# Patient Record
Sex: Female | Born: 1945 | Race: White | Hispanic: No | Marital: Married | Smoking: Never smoker
Health system: Southern US, Community
[De-identification: ages and names within clinical notes are randomized; demographics above are authoritative.]

## PROBLEM LIST (undated history)

## (undated) ENCOUNTER — Emergency Department (HOSPITAL_BASED_OUTPATIENT_CLINIC_OR_DEPARTMENT_OTHER): Payer: Medicare Other | Source: Home / Self Care

## (undated) DIAGNOSIS — Z9889 Other specified postprocedural states: Secondary | ICD-10-CM

## (undated) DIAGNOSIS — N390 Urinary tract infection, site not specified: Secondary | ICD-10-CM

## (undated) DIAGNOSIS — I10 Essential (primary) hypertension: Secondary | ICD-10-CM

## (undated) DIAGNOSIS — E785 Hyperlipidemia, unspecified: Secondary | ICD-10-CM

## (undated) DIAGNOSIS — I491 Atrial premature depolarization: Secondary | ICD-10-CM

## (undated) HISTORY — DX: Hyperlipidemia, unspecified: E78.5

## (undated) HISTORY — PX: APPENDECTOMY: SHX54

## (undated) HISTORY — DX: Atrial premature depolarization: I49.1

## (undated) HISTORY — DX: Essential (primary) hypertension: I10

## (undated) HISTORY — DX: Other specified postprocedural states: Z98.890

## (undated) HISTORY — PX: DILATION AND CURETTAGE OF UTERUS: SHX78

## (undated) HISTORY — PX: BREAST LUMPECTOMY: SHX2

## (undated) HISTORY — PX: TRACHEOSTOMY: SUR1362

---

## 1997-12-17 ENCOUNTER — Other Ambulatory Visit: Admission: RE | Admit: 1997-12-17 | Discharge: 1997-12-17 | Payer: Self-pay | Admitting: Obstetrics & Gynecology

## 1999-04-20 ENCOUNTER — Ambulatory Visit (HOSPITAL_COMMUNITY): Admission: RE | Admit: 1999-04-20 | Discharge: 1999-04-20 | Payer: Self-pay | Admitting: *Deleted

## 1999-10-27 ENCOUNTER — Encounter: Admission: RE | Admit: 1999-10-27 | Discharge: 1999-10-27 | Payer: Self-pay | Admitting: Obstetrics & Gynecology

## 1999-10-27 ENCOUNTER — Encounter: Payer: Self-pay | Admitting: Obstetrics & Gynecology

## 1999-11-22 ENCOUNTER — Other Ambulatory Visit: Admission: RE | Admit: 1999-11-22 | Discharge: 1999-11-22 | Payer: Self-pay | Admitting: Obstetrics & Gynecology

## 2000-11-27 ENCOUNTER — Encounter: Admission: RE | Admit: 2000-11-27 | Discharge: 2000-11-27 | Payer: Self-pay | Admitting: Obstetrics & Gynecology

## 2000-11-27 ENCOUNTER — Encounter: Payer: Self-pay | Admitting: Obstetrics & Gynecology

## 2000-11-29 ENCOUNTER — Encounter: Admission: RE | Admit: 2000-11-29 | Discharge: 2000-11-29 | Payer: Self-pay | Admitting: Family Medicine

## 2000-11-29 ENCOUNTER — Encounter: Payer: Self-pay | Admitting: Obstetrics & Gynecology

## 2001-03-27 ENCOUNTER — Other Ambulatory Visit: Admission: RE | Admit: 2001-03-27 | Discharge: 2001-03-27 | Payer: Self-pay | Admitting: Obstetrics & Gynecology

## 2001-03-29 ENCOUNTER — Encounter: Admission: RE | Admit: 2001-03-29 | Discharge: 2001-03-29 | Payer: Self-pay | Admitting: Obstetrics & Gynecology

## 2001-03-29 ENCOUNTER — Encounter: Payer: Self-pay | Admitting: Obstetrics & Gynecology

## 2001-07-30 ENCOUNTER — Ambulatory Visit (HOSPITAL_COMMUNITY): Admission: RE | Admit: 2001-07-30 | Discharge: 2001-07-30 | Payer: Self-pay | Admitting: Gastroenterology

## 2001-09-17 ENCOUNTER — Encounter: Payer: Self-pay | Admitting: Obstetrics & Gynecology

## 2001-09-17 ENCOUNTER — Encounter: Admission: RE | Admit: 2001-09-17 | Discharge: 2001-09-17 | Payer: Self-pay | Admitting: Obstetrics & Gynecology

## 2002-05-05 ENCOUNTER — Encounter: Admission: RE | Admit: 2002-05-05 | Discharge: 2002-05-05 | Payer: Self-pay | Admitting: Obstetrics & Gynecology

## 2002-05-05 ENCOUNTER — Encounter: Payer: Self-pay | Admitting: Obstetrics & Gynecology

## 2002-10-13 ENCOUNTER — Encounter: Admission: RE | Admit: 2002-10-13 | Discharge: 2002-10-13 | Payer: Self-pay | Admitting: Family Medicine

## 2002-10-13 ENCOUNTER — Encounter: Payer: Self-pay | Admitting: Family Medicine

## 2002-10-15 ENCOUNTER — Other Ambulatory Visit: Admission: RE | Admit: 2002-10-15 | Discharge: 2002-10-15 | Payer: Self-pay | Admitting: Obstetrics & Gynecology

## 2003-04-27 ENCOUNTER — Encounter: Admission: RE | Admit: 2003-04-27 | Discharge: 2003-04-27 | Payer: Self-pay | Admitting: Family Medicine

## 2003-10-20 ENCOUNTER — Other Ambulatory Visit: Admission: RE | Admit: 2003-10-20 | Discharge: 2003-10-20 | Payer: Self-pay | Admitting: Obstetrics & Gynecology

## 2003-11-24 ENCOUNTER — Encounter: Admission: RE | Admit: 2003-11-24 | Discharge: 2003-11-24 | Payer: Self-pay | Admitting: Family Medicine

## 2004-04-05 ENCOUNTER — Ambulatory Visit: Payer: Self-pay | Admitting: Family Medicine

## 2004-04-05 ENCOUNTER — Encounter: Admission: RE | Admit: 2004-04-05 | Discharge: 2004-04-05 | Payer: Self-pay | Admitting: Family Medicine

## 2004-04-06 ENCOUNTER — Encounter: Admission: RE | Admit: 2004-04-06 | Discharge: 2004-04-06 | Payer: Self-pay | Admitting: Family Medicine

## 2004-04-06 ENCOUNTER — Ambulatory Visit: Payer: Self-pay | Admitting: Family Medicine

## 2004-04-14 ENCOUNTER — Ambulatory Visit: Payer: Self-pay | Admitting: Family Medicine

## 2004-04-25 ENCOUNTER — Ambulatory Visit: Payer: Self-pay | Admitting: Family Medicine

## 2004-05-10 ENCOUNTER — Encounter: Admission: RE | Admit: 2004-05-10 | Discharge: 2004-05-10 | Payer: Self-pay | Admitting: Gastroenterology

## 2004-09-01 ENCOUNTER — Ambulatory Visit: Payer: Self-pay | Admitting: Internal Medicine

## 2004-09-12 ENCOUNTER — Ambulatory Visit: Payer: Self-pay | Admitting: Internal Medicine

## 2004-09-21 ENCOUNTER — Ambulatory Visit: Payer: Self-pay

## 2005-01-04 ENCOUNTER — Other Ambulatory Visit: Admission: RE | Admit: 2005-01-04 | Discharge: 2005-01-04 | Payer: Self-pay | Admitting: Obstetrics & Gynecology

## 2005-03-09 ENCOUNTER — Ambulatory Visit: Payer: Self-pay | Admitting: Internal Medicine

## 2005-05-16 ENCOUNTER — Ambulatory Visit: Payer: Self-pay | Admitting: Internal Medicine

## 2005-05-16 ENCOUNTER — Encounter: Admission: RE | Admit: 2005-05-16 | Discharge: 2005-05-16 | Payer: Self-pay | Admitting: Internal Medicine

## 2005-05-26 ENCOUNTER — Ambulatory Visit: Payer: Self-pay | Admitting: Internal Medicine

## 2005-06-27 ENCOUNTER — Ambulatory Visit: Payer: Self-pay | Admitting: Internal Medicine

## 2005-07-18 ENCOUNTER — Ambulatory Visit: Payer: Self-pay | Admitting: Internal Medicine

## 2006-02-06 ENCOUNTER — Ambulatory Visit (HOSPITAL_COMMUNITY): Admission: RE | Admit: 2006-02-06 | Discharge: 2006-02-06 | Payer: Self-pay | Admitting: Obstetrics & Gynecology

## 2006-02-28 ENCOUNTER — Encounter: Admission: RE | Admit: 2006-02-28 | Discharge: 2006-02-28 | Payer: Self-pay | Admitting: Obstetrics & Gynecology

## 2006-03-23 IMAGING — US US ABDOMEN COMPLETE
1 series · 14 of 25 positions shown · non-contrast
Comparison: none

CLINICAL DATA: Abdominal pain.  History of previous bleeding ulcer.  Hypertension.
 ULTRASOUND ABDOMEN COMPLETE:
 There is no evidence of gallstones or gallbladder wall thickening. There is no evidence of biliary ductal dilatation. The liver is within normal limits in echogenicity and no focal parenchymal lesions are identified. The visualized portion of the pancreas is unremarkable in appearance.

[Series 1: unknown · 0.27mm/px · 14 of 55 slices shown]
[im 1/55]
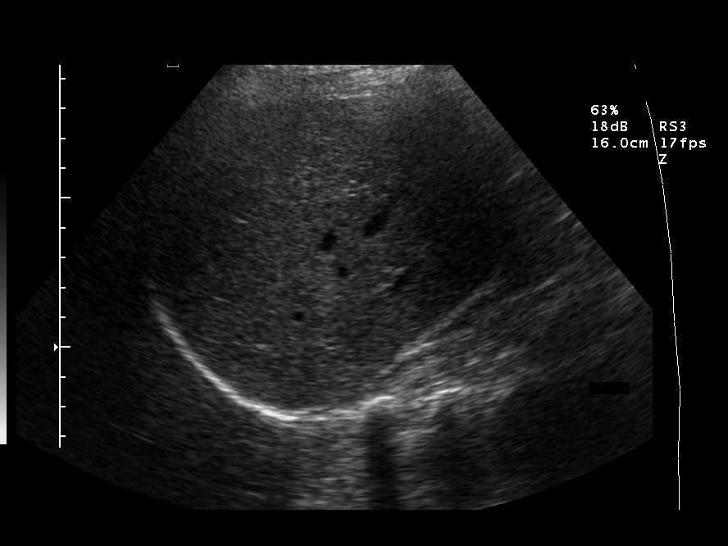
[im 5/55]
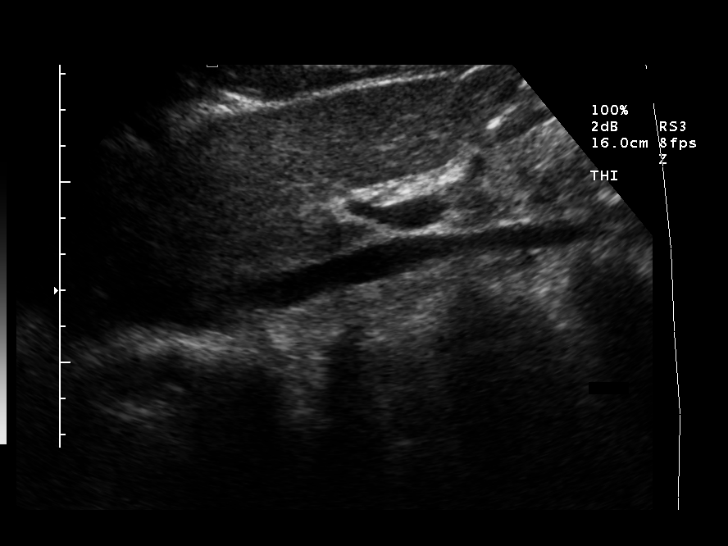
[im 10/55]
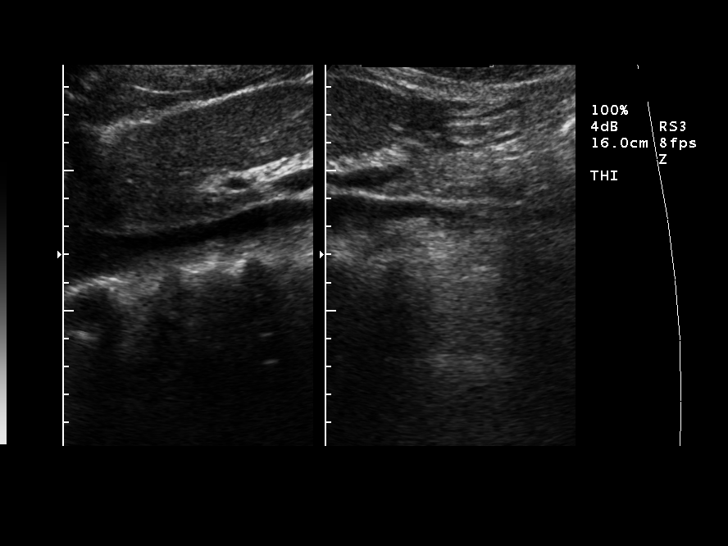
[im 14/55]
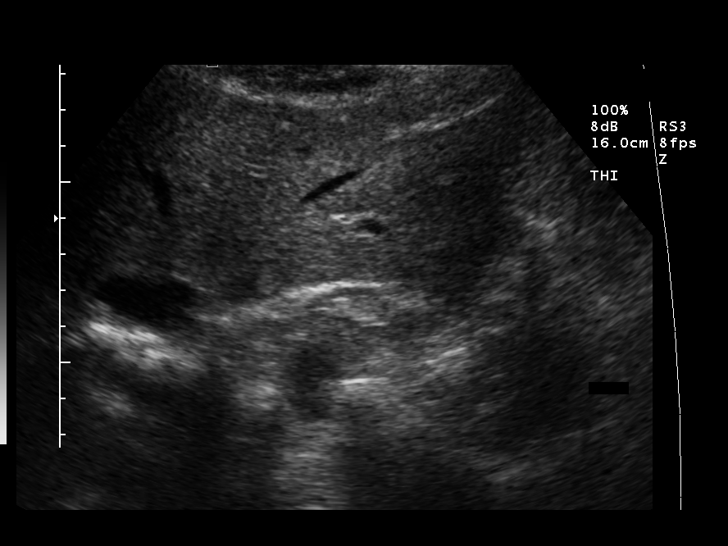
[im 19/55]
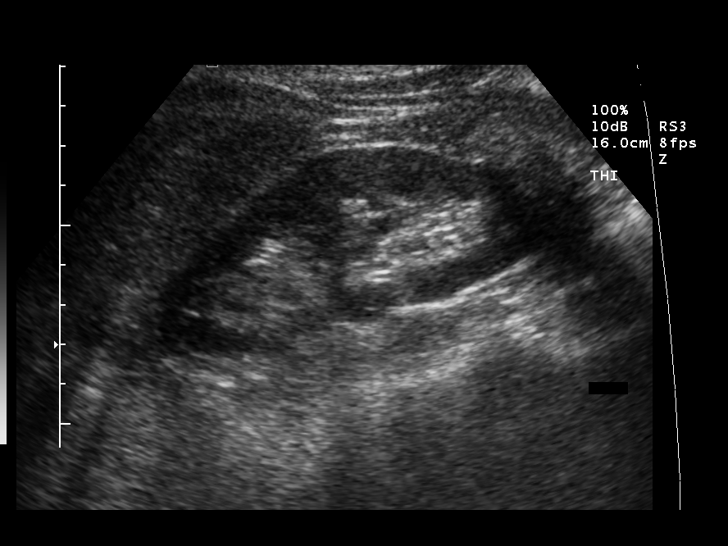
[im 21/55]
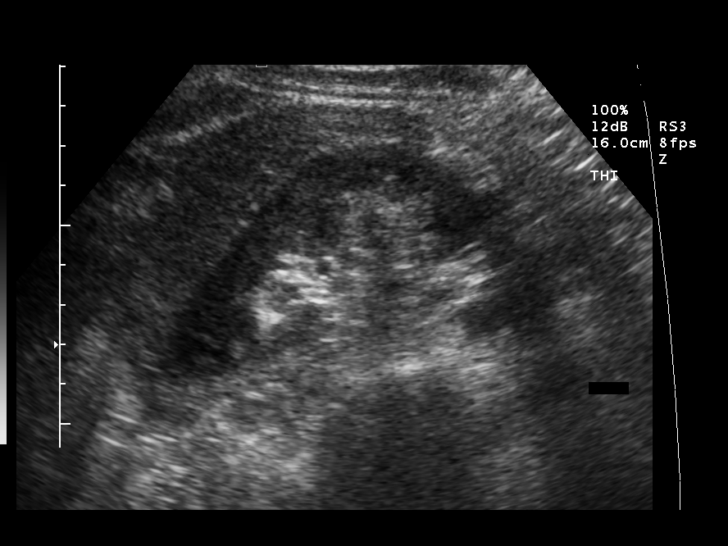
[im 25/55]
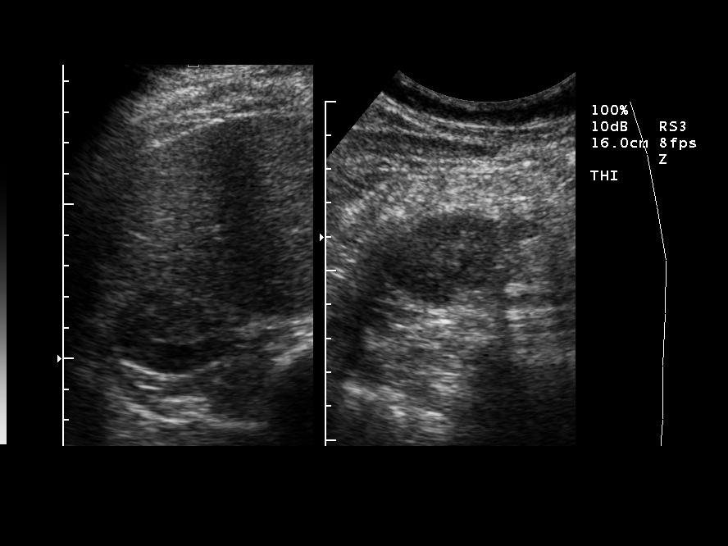
[im 30/55]
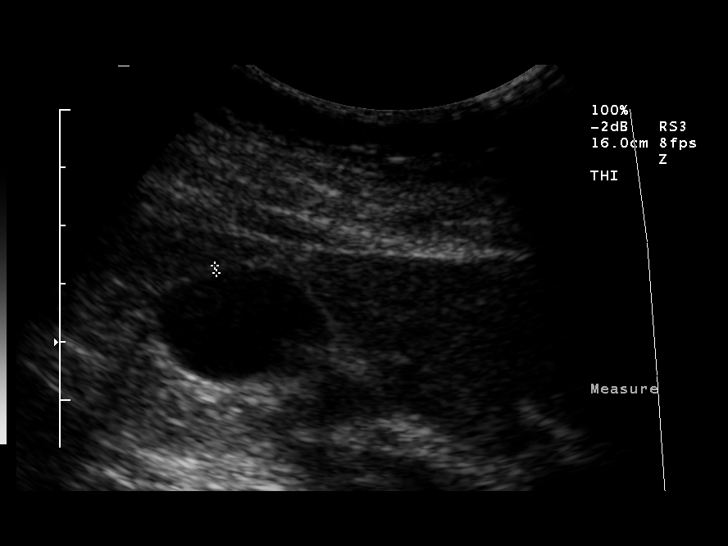
[im 34/55]
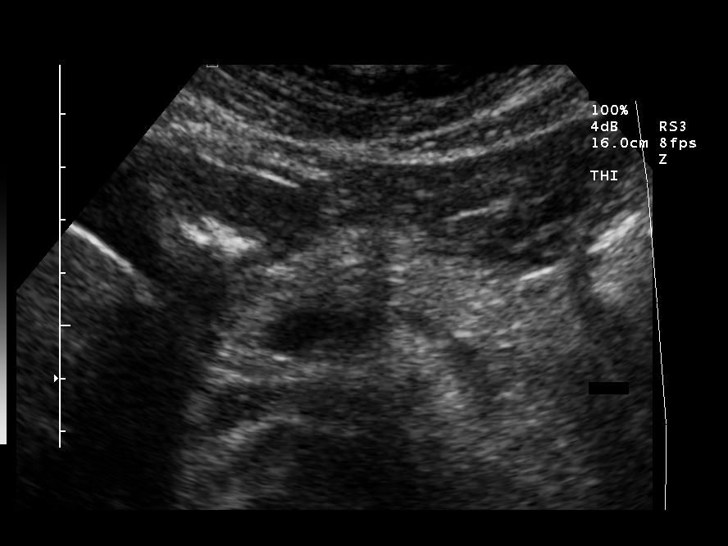
[im 37/55]
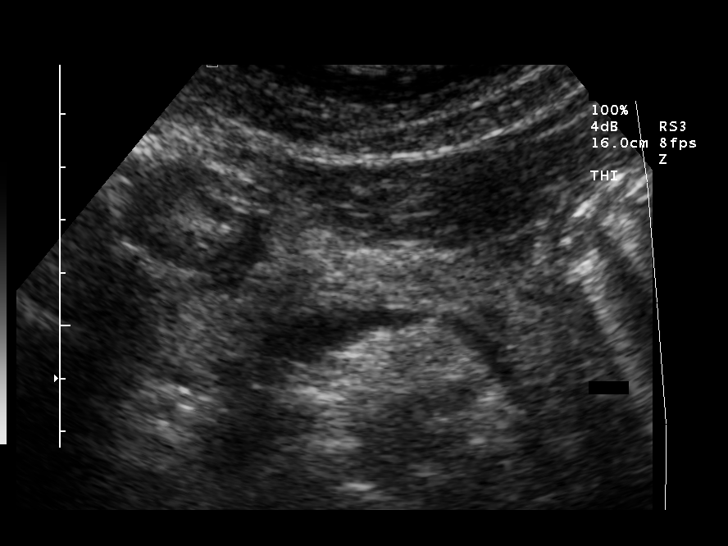
[im 41/55]
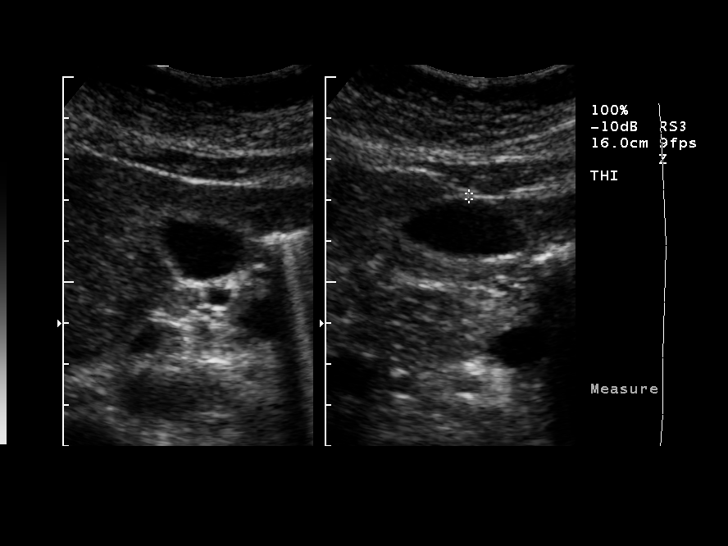
[im 46/55]
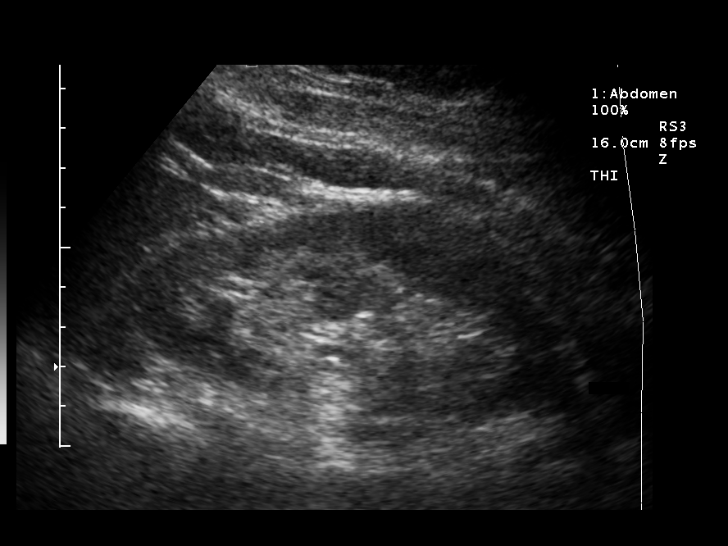
[im 50/55]
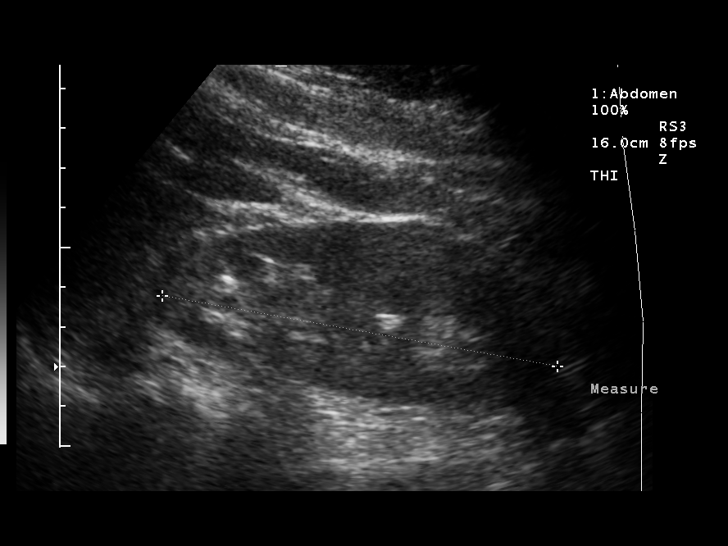
[im 55/55]
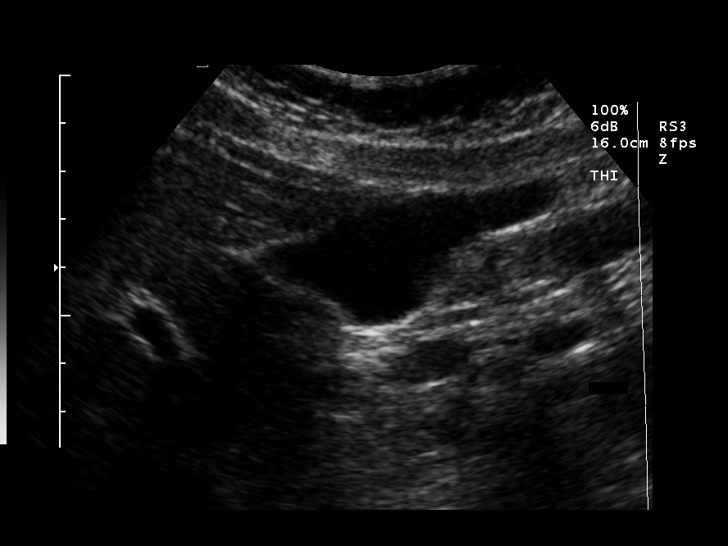

[14 of 25 positions shown; findings below may reference images not displayed]

The kidneys are within normal limits in size and echogenicity and there is no evidence of masses or hydronephrosis.  There is no evidence of splenomegaly or abdominal aortic aneurysm.  Images of the inferior vena cava are unremarkable, and there is no evidence of ascites.  Normal anatomic variant Phrygian cap is seen at the gallbladder.  

 Gallbladder wall thickness 1 mm, common bile duct 6 mm, spleen 6.2 x 8.4 cm, right kidney 10.3 cm long, left kidney 10.5 cm long.  Maximal visualized abdominal aortic diameter 1.9 cm.
IMPRESSION: Since [REDACTED] abdominal ultrasound report, 04/20/99 and the [REDACTED] abdominal CT of 04/06/04, no interval change ? normal.

## 2006-03-30 ENCOUNTER — Encounter (INDEPENDENT_AMBULATORY_CARE_PROVIDER_SITE_OTHER): Payer: Self-pay | Admitting: Specialist

## 2006-03-30 ENCOUNTER — Inpatient Hospital Stay (HOSPITAL_COMMUNITY): Admission: EM | Admit: 2006-03-30 | Discharge: 2006-04-05 | Payer: Self-pay | Admitting: Emergency Medicine

## 2006-03-30 ENCOUNTER — Encounter: Admission: RE | Admit: 2006-03-30 | Discharge: 2006-03-30 | Payer: Self-pay | Admitting: Family Medicine

## 2006-04-15 ENCOUNTER — Emergency Department (HOSPITAL_COMMUNITY): Admission: EM | Admit: 2006-04-15 | Discharge: 2006-04-15 | Payer: Self-pay | Admitting: Emergency Medicine

## 2006-04-16 ENCOUNTER — Ambulatory Visit (HOSPITAL_COMMUNITY): Admission: RE | Admit: 2006-04-16 | Discharge: 2006-04-16 | Payer: Self-pay | Admitting: General Surgery

## 2006-06-22 DIAGNOSIS — Z9189 Other specified personal risk factors, not elsewhere classified: Secondary | ICD-10-CM | POA: Insufficient documentation

## 2006-06-22 DIAGNOSIS — Z862 Personal history of diseases of the blood and blood-forming organs and certain disorders involving the immune mechanism: Secondary | ICD-10-CM | POA: Insufficient documentation

## 2006-06-22 DIAGNOSIS — N6019 Diffuse cystic mastopathy of unspecified breast: Secondary | ICD-10-CM | POA: Insufficient documentation

## 2006-06-22 DIAGNOSIS — N951 Menopausal and female climacteric states: Secondary | ICD-10-CM | POA: Insufficient documentation

## 2006-06-22 DIAGNOSIS — Z8639 Personal history of other endocrine, nutritional and metabolic disease: Secondary | ICD-10-CM

## 2006-08-14 ENCOUNTER — Encounter: Admission: RE | Admit: 2006-08-14 | Discharge: 2006-08-14 | Payer: Self-pay | Admitting: Family Medicine

## 2006-08-21 ENCOUNTER — Encounter (INDEPENDENT_AMBULATORY_CARE_PROVIDER_SITE_OTHER): Payer: Self-pay | Admitting: *Deleted

## 2007-12-26 ENCOUNTER — Ambulatory Visit: Payer: Self-pay | Admitting: Cardiology

## 2008-01-08 ENCOUNTER — Ambulatory Visit: Payer: Self-pay | Admitting: Cardiology

## 2008-01-08 ENCOUNTER — Ambulatory Visit: Payer: Self-pay

## 2008-01-08 ENCOUNTER — Encounter: Payer: Self-pay | Admitting: Cardiology

## 2008-01-08 LAB — CONVERTED CEMR LAB
Alkaline Phosphatase: 73 units/L (ref 39–117)
Bilirubin, Direct: 0.1 mg/dL (ref 0.0–0.3)
Calcium: 9.4 mg/dL (ref 8.4–10.5)
GFR calc Af Amer: 94 mL/min
GFR calc non Af Amer: 78 mL/min
Glucose, Bld: 86 mg/dL (ref 70–99)
HDL: 41.1 mg/dL (ref >39.0)
Potassium: 3.9 meq/L (ref 3.5–5.1)
Sodium: 143 meq/L (ref 135–145)
TSH: 0.71 microintl units/mL (ref 0.35–5.50)
Total Bilirubin: 0.8 mg/dL (ref 0.3–1.2)
Total CHOL/HDL Ratio: 5.3
Total Protein: 7 g/dL (ref 6.0–8.3)
Triglycerides: 247 mg/dL (ref 0–149)

## 2008-02-26 IMAGING — CT CT PELVIS W/ CM
1 of 2 series · 15 of 32 positions shown, 19 images · IV contrast (APPLIED)
Comparison: 03/30/06.

CLINICAL DATA: 60-year-old female status post appendectomy.  Right lower quadrant abdominal pain.  
ABDOMEN CT WITH CONTRAST:
TECHNIQUE: Multidetector CT imaging of the abdomen was performed following the standard protocol during bolus administration of intravenous contrast.
Contrast:  125 ml Omnipaque 300.
TECHNIQUE: Multidetector CT imaging of the pelvis was performed following the standard protocol during bolus administration of intravenous contrast.

[Series 2: abd_pel 5.0 b40f st · axial · 0.64mm/px · z∈[-495,-90]mm · 15 of 91 slices shown, 19 images]
[im 5/91  soft-tissue]
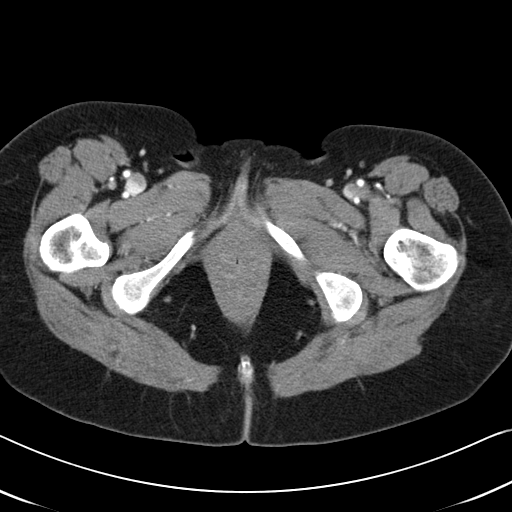
[im 5/91  bone]
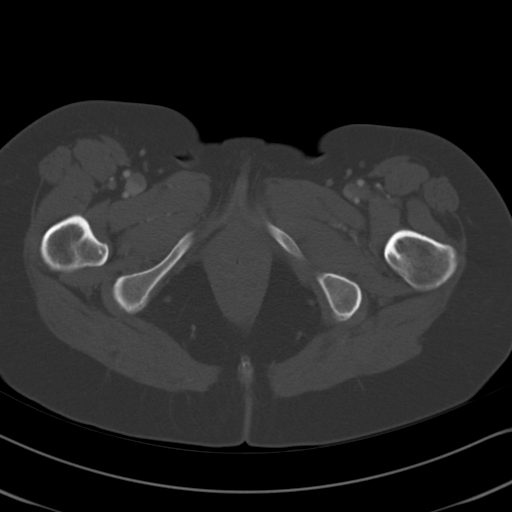
[im 13/91  soft-tissue]
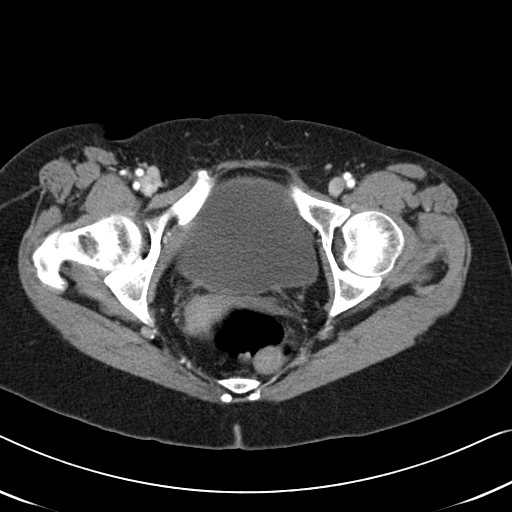
[im 21/91  soft-tissue]
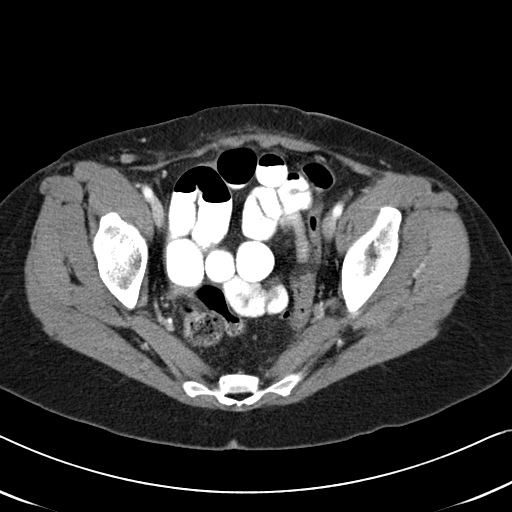
[im 25/91  soft-tissue]
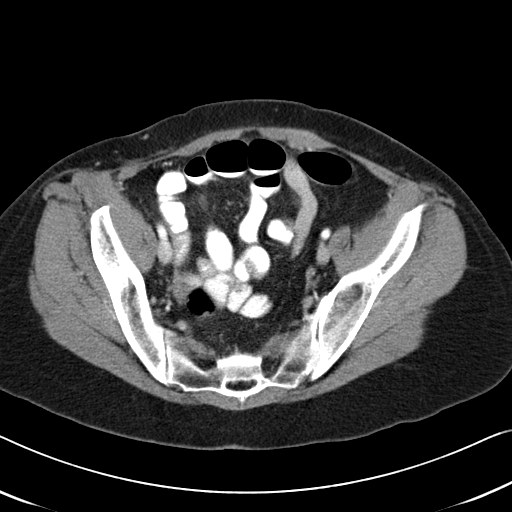
[im 33/91  soft-tissue]
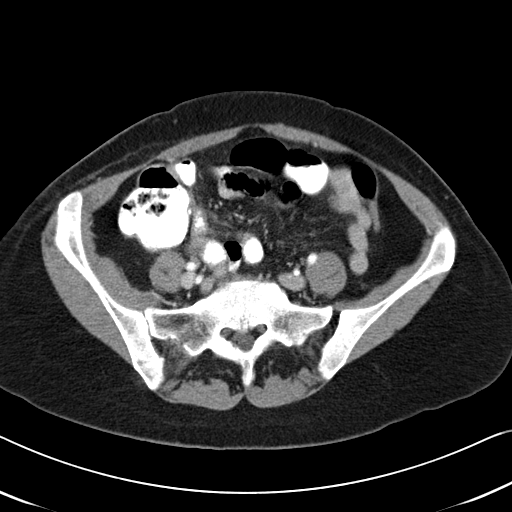
[im 37/91  soft-tissue]
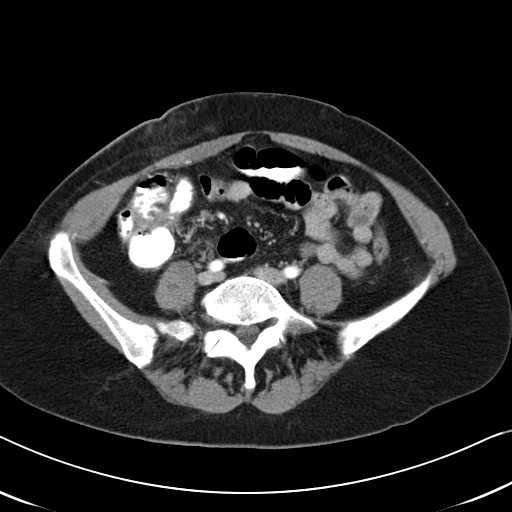
[im 46/91  soft-tissue]
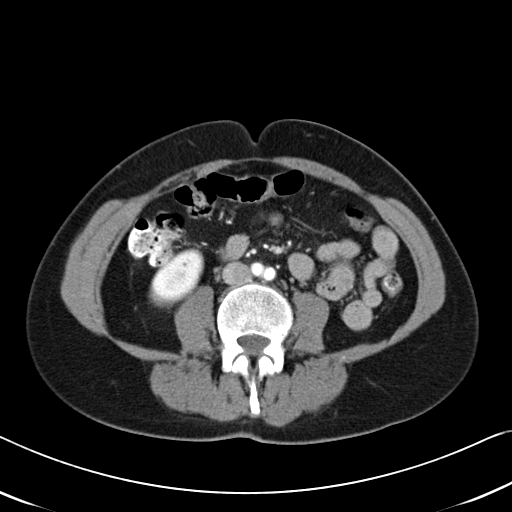
[im 54/91  soft-tissue]
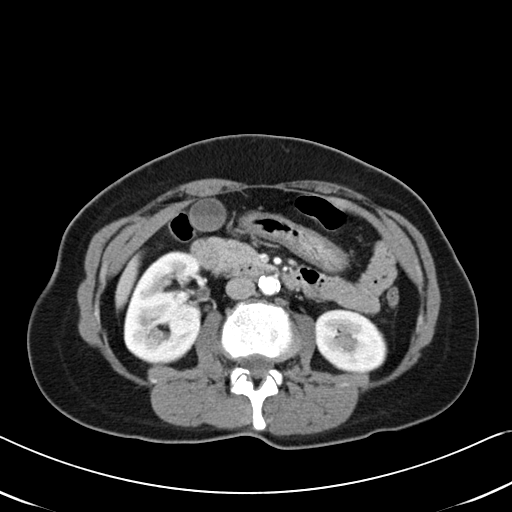
[im 58/91  soft-tissue]
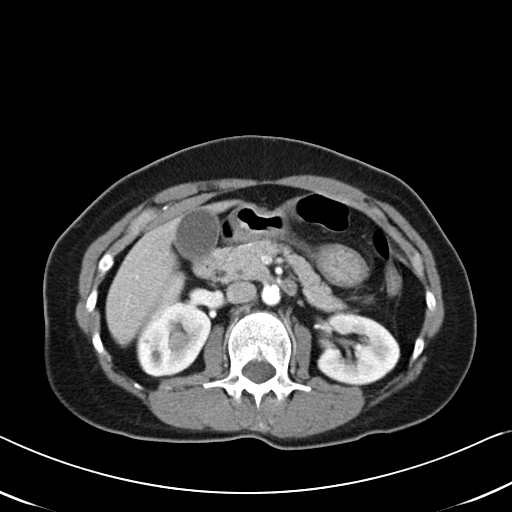
[im 58/91  bone]
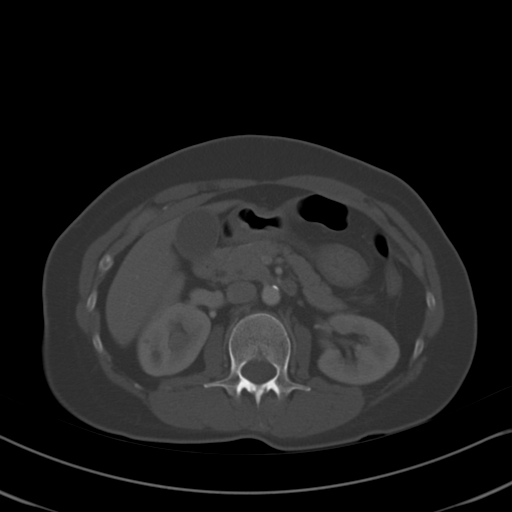
[im 66/91  soft-tissue]
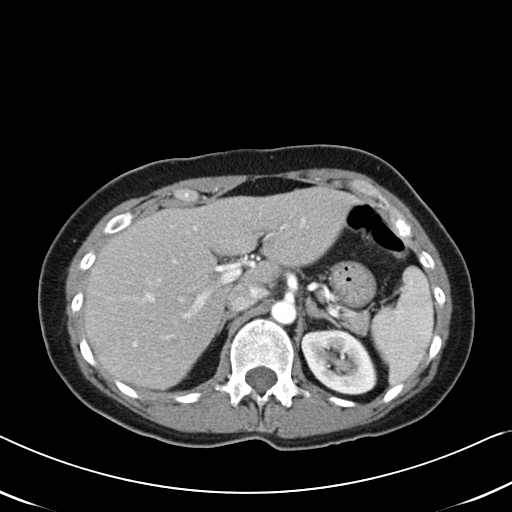
[im 70/91  soft-tissue]
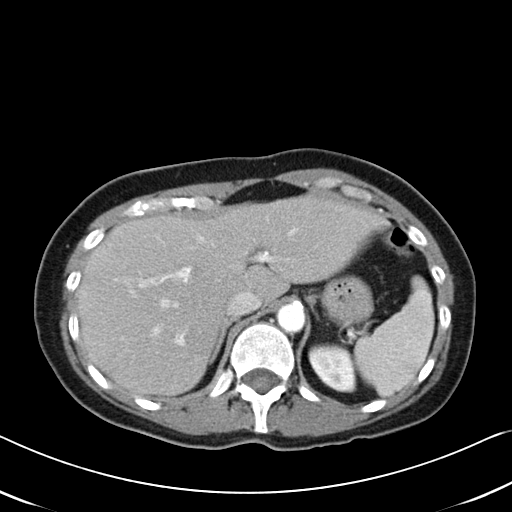
[im 74/91  lung]
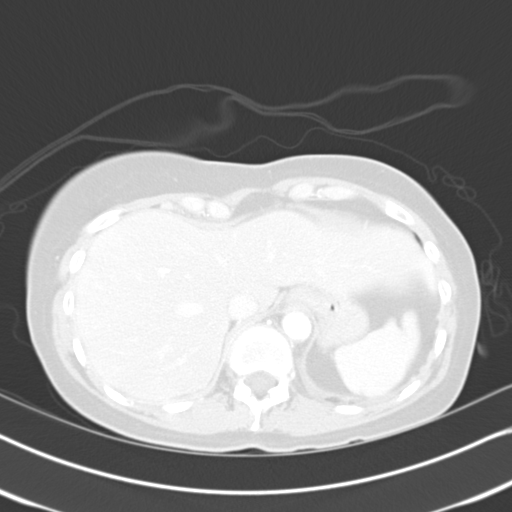
[im 78/91  soft-tissue]
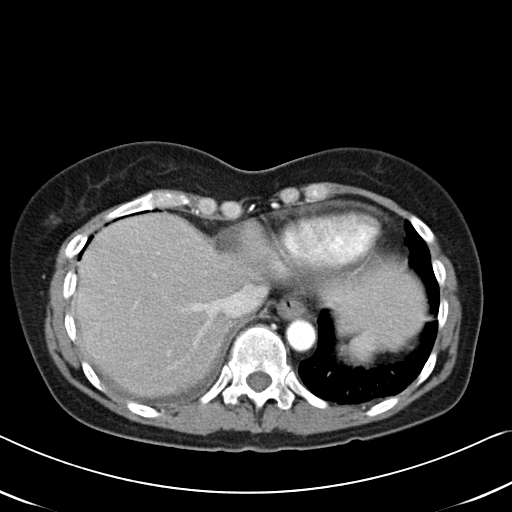
[im 78/91  lung]
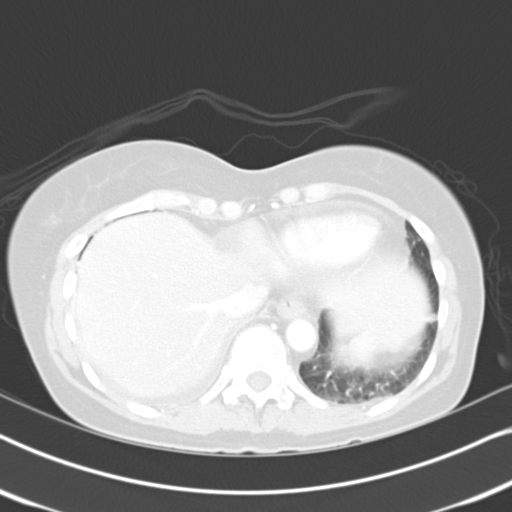
[im 82/91  lung]
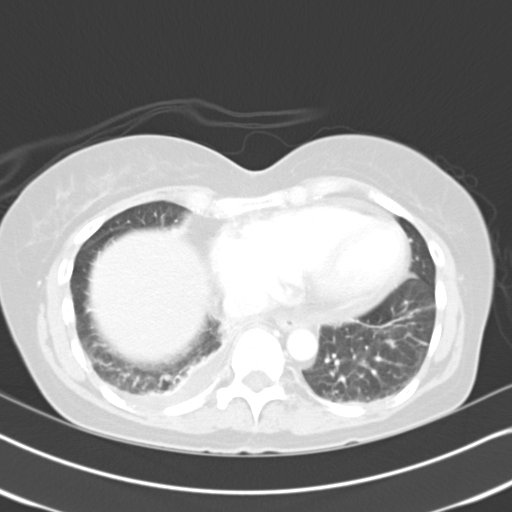
[im 86/91  soft-tissue]
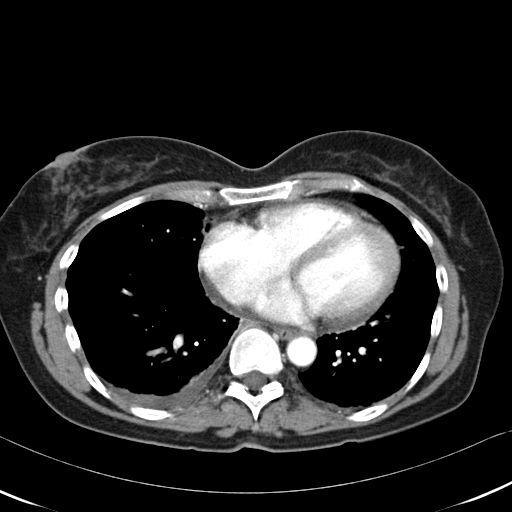
[im 86/91  lung]
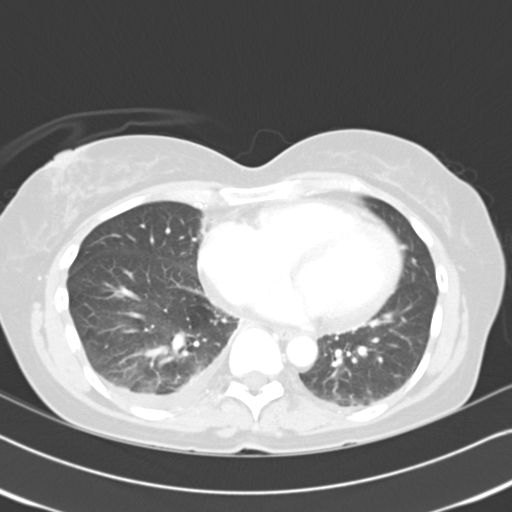

[15 of 32 positions shown; findings below may reference images not displayed]

FINDINGS: Heart size is normal.  Small right pleural effusion is now present with associated atelectasis.  The lungs are otherwise clear.  No significant pericardial or left pleural effusion.  The appearance of the liver and spleen is normal.  Stomach is unremarkable.  Pancreas is within normal limits.  Gallbladder and common bile duct are unremarkable.  Subcentimeter bilateral renal cysts are re-demonstrated.  The adrenal glands are within normal limits.  There is no significant abdominal lymphadenopathy or free fluid. 
Bone windows:  Degenerative changes are noted at L5-S1.
IMPRESSION: No acute abdominal abnormality.  
PELVIS CT WITH CONTRAST:
FINDINGS: Small subcentimeter lymph nodes are present within the ileocolic ligament.  There is minimal stranding in the subcutaneous fat on the right likely associated with the patient?s abdominal incision.  Appendix is surgically absent.  There is no free fluid or abnormal collection.  Uterus and adnexa are unremarkable.  The remainder of the colon is within normal limits.  The urinary bladder is unremarkable.
Bone windows are within normal limits.
IMPRESSION: Postoperative changes of appendectomy with subcutaneous scar seen and subcentimeter lymph nodes in the ileocolic ligament but no residual fluid collection or abscess to explain pain.

## 2009-04-29 ENCOUNTER — Encounter: Admission: RE | Admit: 2009-04-29 | Discharge: 2009-04-29 | Payer: Self-pay | Admitting: Gastroenterology

## 2010-04-02 ENCOUNTER — Encounter: Payer: Self-pay | Admitting: Family Medicine

## 2010-04-03 ENCOUNTER — Encounter: Payer: Self-pay | Admitting: Family Medicine

## 2010-07-26 NOTE — Assessment & Plan Note (Signed)
[facility]                            CARDIOLOGY OFFICE NOTE   Whitney Potter                     MRN:          [id number]  DATE:12/26/2007                            DOB:          07-20-1945    CHIEF COMPLAINT:  Heart fluttering, shortness of breath, and chest pain.   HISTORY OF PRESENT ILLNESS:  Whitney Potter is a 65 year old married  white female who I saw initially back in 1993.  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  She also had pericarditis at that time.  This  resolved as well.   She has some atypical chest pain few years ago in 2004.  She had a  stress Myoview, which was normal with excellent exercise tolerance.   She has recently been having chest pressure and she says when she tries  to work like a man she gets short of breath and has chest pressure.   She denies any orthopnea, PND, or peripheral edema.  She has had no  fever, chills, or sweats.   She is also had a lot of palpitations particularly when she lays down at  night.  She says she feels like her heart turns over like  a close  dryer.  These are particularly bad at night and woken her up on  occasion.  She denies any presyncope or syncope.   She does have several cardiac risk factors including hyperlipidemia on  Crestor and hypertension.  Her mother also suffered a massive heart  attack at age 93.   PAST MEDICAL HISTORY:   ALLERGIES:  She has no known dye allergy.  She is intolerant of CODEINE,  DEMEROL, and DARVON.   She does not smoke or drink.   She stays very active.  She does not exercise per se on a regular basis.   SURGICAL HISTORY:  Tonsillectomy in 1952, tracheotomy in 1952, and  appendectomy in 2008.   CURRENT MEDICATIONS:  1. Crestor 10 mg a day.  2. Bisoprolol/HCTZ 5/6.25 daily.  3. Omeprazole 40 mg a day.  4. Calcium and magnesium with vitamin D.  5. Lutein 20 mg a  day.  6. Fish oil 1000 mg a day.  7. Vitamin E.  8. Vitamin C.  9. Flaxseed oil.  10.Glucosamine.  11.Chondroitin.  12.Probiotic.   FAMILY HISTORY:  Remarkable with her mother having a heart attack at age  6.   SOCIAL HISTORY:  She is married and has 1 child who is 75 years of age.  She has 2 grandchildren.  She is a Education officer, environmental, working with  Occupational psychologist.  She had been retired since 2001 overall.  She has not  been disabled.   REVIEW OF SYSTEMS:  Other than HPI is negative.  She does have some mild  reflux symptoms.   PHYSICAL EXAMINATION:  VITAL SIGNS:  Her blood pressure 110/70, her  pulse 65 and regular.  She is 5 feet and 6, and weighs 130 pounds.  HEENT:  Normal.  Carotids were equal bilaterally without bruits.  No  JVD.  Thyroid is not enlarged.  Trachea is midline.  NECK:  Supple.  LUNGS:  Clear to auscultation and percussion without rub.  HEART:  A nondisplaced PMI.  Normal S1 and S2.  No gallop or rub.  ABDOMEN:  Soft.  Good bowel sounds.  No midline bruit.  No hepatomegaly.  EXTREMITIES:  No sinus, clubbing, or edema.  Pulses are intact.  NEURO:  Intact.  No sign of DVT.  SKIN:  Unremarkable.   Electrocardiogram is normal.   ASSESSMENT AND PLAN:  Whitney Potter is having chest tightness and  shortness of breath with exertion.  She does have cardiac risk factors.  We will set up for an exercise rest/stress Myoview to rule out any  obstructive coronary artery disease.   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  We  will also arrange for a 2-D echocardiogram.   With her palpitations particularly at night which were almost daily, we  will get a 24-hour Holter monitor to identify the source.   I have not made any changes in her medical program.  We will her with  these results.  If there is a significant abnormality, we will schedule  back for followup.  Otherwise, we will see her p.r.n.     Thomas C. Daleen Squibb, MD, [facility]   Electronically Signed    TCW/MedQ  DD: 12/26/2007  DT: 12/27/2007  Job #: [id number]   cc:   Derrek Gu, MD

## 2010-07-29 NOTE — Op Note (Signed)
NAMEALZORA, HA              ACCOUNT NO.:  [id number]   MEDICAL RECORD NO.:  [id number]          PATIENT TYPE:  INP   LOCATION:  1517                         FACILITY:  [facility]   PHYSICIAN:  Angelia Mould. Derrell Lolling, M.D.DATE OF BIRTH:  04/25/1945   DATE OF PROCEDURE:  03/30/2006  DATE OF DISCHARGE:                               OPERATIVE REPORT   PREOPERATIVE DIAGNOSIS:  Acute appendicitis   POSTOPERATIVE DIAGNOSES:  Acute ruptured appendicitis.   OPERATION PERFORMED:  Diagnostic laparoscopy, open appendectomy.   SURGEON:  Dr. Claud Kelp.   OPERATIVE INDICATIONS:  This is a 65 year old white female who has had  lower abdominal pain for at least 4 days. This has been getting  progressively worse.  She has been having hard shaking chills.  She was  reluctant to come for medical care but could not stand it anymore today.  She went to [facility]  She had a CT scan which showed acute  appendicitis and a little bit of fluid, no other abnormalities.  She  came to the [facility] emergency room.  I was called by the [facility]  emergency physician to see the patient.  She had localized tenderness in  the right lower quadrant, she was extremely tender with a lot of  spasticity and she was having shaking chills in the ER.  She was given  antibiotics and taken promptly to the operating room.   OPERATIVE FINDINGS:  The appendix was very inflamed, extremely thickened  and had ruptured into the mesentery.  The dissection was extremely  difficult and the appendix was almost rock hard and the dissection  planes between the terminal ileum and the cecum were almost impossible  to define.  I got to a point in the dissection were I was unwilling to  go further laparoscopically due to concern that I might perforate the  cecum and converted to an open procedure at that point.  During  dissection of the appendiceal mesentery, I did enter a small abscess  suggesting that the appendix had  perforated into the mesentery.  The  right colon, liver, gallbladder and terminal ileum otherwise looked  normal.  The right ovary and uterus looked normal.   OPERATIVE TECHNIQUE:  Following the induction of general endotracheal  anesthesia, the patient's abdomen was prepped and draped in a sterile  fashion.  A Foley catheter had been inserted by the emergency department  staff.  Intravenous antibiotics were given prior to the incision.  The  patient was identified as to correct patient and correct procedure.  0.5% Marcaine with epinephrine was used as a local infiltration  anesthetic.  An 11-mm Optiview port was placed in the left rectus muscle  above the umbilicus.  This entry was atraumatic.  Pneumoperitoneum was  created.  A video camera was inserted and the patient was positioned.  A  5-mm trocar was placed in the left rectus sheath below the umbilicus and  a 12-mm trocar placed in the suprapubic area.  We began our examination  and dissection by mobilizing the small bowel up out of the pelvis and  over to  the left abdomen and left upper quadrant.  There was very  intense inflammation at the ileocecal valve and the cecum, but I could  identify what appeared to be an extremely thickened, foreshortened  appendix.  We divided the lateral peritoneal attachments of the cecum  and mobilized the cecum and a little bit of the right colon medially to  gain exposure.  We had slow bleeding from the mesentery which was  controlled nicely with the harmonic scalpel.  We took the dissection  into the mesentery very cautiously.  It got to a point where we could  not determine the dissection plane between the mesentery of the appendix  and terminal ileum and mesentery of the appendix and the base of the  cecum and so I banded the laparoscopic approach.  A right lower quadrant  transverse incision was made.  Dissection was carried down through the  subcutaneous tissue.  I divided the lateral aspect of  the anterior  rectus sheath and the medial aspect of the external oblique and then  divided the tissue planes with electrocautery, entering the peritoneal  space atraumatically.  Self-retaining retractors were placed.  The cecum  and appendix and terminal ileum were brought into the wound.  I could  then carefully palpate and pinch the tissues until I could slowly  dissect the appendiceal mesentery with a right angle clamp and divide it  with harmonic scalpels.  When I got the dissection of the appendix all  the way back to the base of the cecum, it was very close to the terminal  ileum and it with somewhat thickened there.  I very carefully placed a  GIA stapling device transversely across this so as to avoid any  compromise of the lumen of the ileocecal valve.  I closed the stapler  and held it in place for about 30 seconds and then fired it and removed  it.  The appendix was sent to pathology.  The staple line actually  looked quite good and was very secure hemostatic.  I spent some time  irrigating the right lower quadrant and checking the area of dissection  where the appendix had become imbedded in the mesentery of the terminal  ileum.  All bleeding was controlled quite nicely.  The terminal ileum  looked healthy, the cecum looked healthy and after irrigating about 1  liter and removing all the fluid, I felt this was all we needed to do.  The terminal ileum and cecum were returned to their anatomic positions.  The posterior rectus sheath and the peritoneum and the transversus  abdominis and internal oblique were closed with a running suture of #0  Vicryl.  The wound was irrigated with saline.  The anterior rectus  sheath and the external oblique were closed with a running suture of #1  PDS.  All of the incisions were closed with skin staples.  Clean  bandages were placed and the patient taken to the recovery room in stable condition.  Estimated blood loss was about 75 mL.   Complications  none.  Sponge, needle and instrument counts were correct.      Angelia Mould. Derrell Lolling, M.D.  Electronically Signed     HMI/MEDQ  D:  03/30/2006  T:  03/31/2006  Job:  [id number]   cc:   Willow Ora, MD  [phone] [place], [place] [address]

## 2010-07-29 NOTE — Procedures (Signed)
. [facility]  Patient:    Whitney Potter, Whitney Potter Visit Number: [id number] MRN: [id number]          Service Type: END Location: ENDO Attending Physician:  Charna Elizabeth Dictated by:   Anselmo Rod, M.D. Proc. Date: 07/30/01 Admit Date:  07/30/2001 Discharge Date: 07/30/2001   CC:         Claretta Fraise, M.D.   Procedure Report  DATE OF BIRTH:  25-Jun-1945.  REFERRING PHYSICIAN:  Claretta Fraise, M.D.  PROCEDURE PERFORMED:  Screening colonoscopy.  ENDOSCOPIST:  Anselmo Rod, M.D.  INSTRUMENT USED:  Adjustable pediatric colonoscope.  INDICATIONS FOR PROCEDURE:  The patient is a 65 year old white female with a family history of colon cancer and trace guaiac positive stools.  Rule out colonic polyps, masses, hemorrhoids, etc.  PREPROCEDURE PREPARATION:  Informed consent was procured from the patient. The patient was fasted for eight hours prior to the procedure and prepped with a bottle of magnesium citrate and a gallon of NuLytely the night prior to the procedure.  PREPROCEDURE PHYSICAL:  The patient had stable vital signs.  Neck supple. Chest clear to auscultation.  S1, S2 regular.  Abdomen soft with normal bowel sounds.  DESCRIPTION OF PROCEDURE:  The patient was placed in the left lateral decubitus position and sedated with 70 mcg of fentanyl and 7 mg of Versed intravenously.  Once the patient was adequately sedated and maintained on low-flow oxygen and continuous cardiac monitoring, the Olympus video colonoscope was advanced from the rectum to the cecum without difficulty.  The entire colonic mucosa appeared healthy.  There was some residual stool in the colon on the right side.  Multiple washes were done.  No masses, polyps, erosions or ulcerations were seen.  Small internal hemorrhoids were appreciated on retroflexion in the rectum.  The patient tolerated the procedure well without complication.  IMPRESSION:  Normal  colonoscopy except for small nonbleeding internal hemorrhoids.  RECOMMENDATIONS: 1. A high fiber diet has been recommended for the patient. 2. Repeat colorectal cancer screening is recommended in the next five years    unless the patient were to develop any abnormal symptoms in the interim. 3. Outpatient follow-up on a p.r.n. basis.Dictated by:   Anselmo Rod, M.D.  Attending Physician:  Charna Elizabeth DD:  07/30/01 TD:  08/01/01 Job: [id number]

## 2010-07-29 NOTE — H&P (Signed)
Whitney Potter, DOCTER              ACCOUNT NO.:  [id number]   MEDICAL RECORD NO.:  [id number]          PATIENT TYPE:  INP   LOCATION:  1517                         FACILITY:  [facility]   PHYSICIAN:  Whitney Potter, M.D.DATE OF BIRTH:  February 20, 1946   DATE OF ADMISSION:  03/30/2006  DATE OF DISCHARGE:                              HISTORY & PHYSICAL   CHIEF COMPLAINT:  Right lower quadrant pain and chills.   HISTORY OF PRESENT ILLNESS:  This is a 65 year old white female in  fairly good health.  She reports lower abdominal pain for 4 days,  gradual onset, progressive each day, having chills today.  No nausea or  vomiting although did not have much appetite for lunch today.  She tried  to just endure this and she could not endure it any longer today.  She  apparently went to  [facility].  Apparently they sent her for a CT scan  at [facility].  That CT scan showed a thickened appendix extending down into  the pelvis and just a trace of fluid, no other abnormality noted.  Apparently she was sent to the [facility] ER.  I was called at 8:45  p.m. and was alerted to her presence and I came to see her shortly  thereafter.  She is a being admitted for management of her presumed  appendicitis.   PAST HISTORY:  She has had 4 pregnancies, 3 miscarriages and 1 delivery.  She states she had a ruptured ovarian cyst possibly on the left side in  2001.  She had tracheostomy at age 65 for unclear reasons.  She says it  just closed up.  She has had two laparoscopies for infertility workup.  She had a left breast biopsy in 1971 for precancerous problem.  She  has hypertension and hyperlipidemia.   CURRENT MEDICATIONS:  Hydrochlorothiazide, Ziac, Bisoprolol, Prilosec  p.r.n., vitamins, herbs.   DRUG ALLERGIES:  DEMEROL, DARVON, CODEINE.   SOCIAL HISTORY:  She lives in [place], she is married, they have 1  child.  She works as a Restaurant manager, fast food person.  She denies alcohol  or tobacco.   FAMILY  HISTORY:  Father living and has hyperlipidemia.  Mother died of  acute renal failure, had COPD and coronary artery disease.  She has one  sister.   REVIEW OF REVIEW:  A 15-system review of systems is performed and is  noncontributory except as described above.   PHYSICAL EXAMINATION:  GENERAL:  Pleasant middle-aged woman in moderate  distress.  She is having shaking chills and has moderate pain.  She is  cooperative.  VITAL SIGNS:  Temperature 97.2, pulse 87 and regular, respirations 20,  blood pressure 149/88.  HEENT:  Eyes, sclera clear. Extraocular movements intact. Ears, nose,  mouth, throat, nose, lips, tongue and oropharynx are without gross  lesions.  She does have a vertical scar in the suprasternal notch  presumably from her previous tracheostomy.  LUNGS:  Clear to auscultation.  No chest wall tenderness.  HEART:  Regular rate and rhythm.  No murmur.  Radial and femoral pulses  are palpable.  BREASTS:  Not examined.  ABDOMEN: Actually quite soft except in the right lower quadrant where  she has significant tenderness, involuntary guarding and spasticity.  I  cannot appreciate a mass.  There appears to be some scars around her  umbilicus.  There is no hernia noted.  Liver and spleen are not  enlarged.  EXTREMITIES:  She moves all four extremities well without pain or  deformity.  NEUROLOGIC:  No gross motor or sensory deficits.   ADMISSION DATA:  The CT scan shows a thickened inflamed appendix  extending from the cecum down into the pelvis.  There is a trace of  fluid.  No other gross abnormalities noted. White blood cell count is  11,000.  The rest of her lab is pending.  Her urinalysis is pending but  her urine looked clear.   ASSESSMENT:  1. Acute appendicitis.  Possibility of early rupture is considered due      to the rigors and degree of pain.  2. Hypertension.  3. Status post infertility workup.  4. Remote history of tracheostomy.   PLAN:  The patient will be  admitted, started on intravenous antibiotics,  IV fluid and taken to the operating room this evening for a diagnostic  laparoscopy, laparoscopic appendectomy, possible laparotomy if this is  complicated.   I have discussed the indication and details of surgery with her and her  family.  The risks and complications have been outlined, including but  not limited to bleeding, infection, conversion to open laparotomy,  injury to adjacent organs such as the intestine or bladder with major  reconstructive surgery, wound problems such as infection or hernia, and  other unforeseen problems.  She seems to understand these issues well.  At this time all of her questions are answered.      Whitney Potter, M.D.  Electronically Signed     HMI/MEDQ  D:  03/30/2006  T:  03/31/2006  Job:  [id number]   cc:   Willow Ora, MD  [phone] [place], [place] [address]   W. Varney Baas, M.D.  Fax: [phone]

## 2011-05-16 ENCOUNTER — Ambulatory Visit (INDEPENDENT_AMBULATORY_CARE_PROVIDER_SITE_OTHER): Payer: BC Managed Care – PPO | Admitting: Emergency Medicine

## 2011-05-16 ENCOUNTER — Encounter: Payer: Self-pay | Admitting: Emergency Medicine

## 2011-05-16 DIAGNOSIS — Z930 Tracheostomy status: Secondary | ICD-10-CM

## 2011-05-16 DIAGNOSIS — R05 Cough: Secondary | ICD-10-CM

## 2011-05-16 DIAGNOSIS — E785 Hyperlipidemia, unspecified: Secondary | ICD-10-CM

## 2011-05-16 DIAGNOSIS — Z9889 Other specified postprocedural states: Secondary | ICD-10-CM

## 2011-05-16 DIAGNOSIS — I10 Essential (primary) hypertension: Secondary | ICD-10-CM

## 2011-05-16 NOTE — Assessment & Plan Note (Signed)
Sustained by GERD and PND. Will try to treat both before escalating workup. If her breathing remains problmatic or if I cant control cough, then will get PFT, consider FOB given her trach (? Granulation tissue)

## 2011-05-16 NOTE — Progress Notes (Signed)
Subjective:    Patient ID: Whitney Potter, female    DOB: 13-Sep-1945, 66 y.o.   MRN: [id number]  HPI 66 yo woman, never smoker, hx of HTN, hyperlipidemia, trach at age 66 for for narrow airway. Seen by S. Christell Constant, NP, at [facility] for cough and dyspnea.  She reports that about a year ago she started having cough, dry hack. It improved for a couple months but then it returned. No sputum. 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 Has been treated on several occasions w steroids, abx - treated in Feb 2013. Felt chest congestion and tightness but the cough was non-productive. Has been rx w albuterol w mixed results. Has also been tried on tussionex, mucinex. She has heartburn sx, has been on omeprazole but only intermittently, rarely uses flonase spray, only one spray. Not on claritin or benadryl.    Review of Systems  Constitutional: Negative.  Negative for fever and unexpected weight change.  HENT: Positive for congestion and sneezing. Negative for ear pain, nosebleeds, sore throat, rhinorrhea, trouble swallowing, dental problem, postnasal drip and sinus pressure.   Eyes: Positive for visual disturbance. Negative for redness and itching.  Respiratory: Positive for cough, chest tightness, shortness of breath and wheezing.   Cardiovascular: Positive for chest pain. Negative for palpitations and leg swelling.  Gastrointestinal: Negative.  Negative for nausea and vomiting.  Genitourinary: Negative.  Negative for dysuria.  Musculoskeletal: Positive for arthralgias. Negative for joint swelling.  Skin: Negative.  Negative for rash.  Neurological: Positive for headaches.  Hematological: Negative.  Does not bruise/bleed easily.  Psychiatric/Behavioral: Negative.  Negative for dysphoric mood. The patient is not nervous/anxious.     Past Medical History  Diagnosis Date  . Hypertension   . Hyperlipidemia   . H/O tracheostomy      Family  History  Problem Relation Age of Onset  . COPD Mother   . Heart attack Paternal Grandmother   . Heart attack Maternal Aunt   . Kidney cancer Paternal Grandfather   . Liver cancer Paternal Aunt   . Colon cancer Paternal Uncle   . Lung cancer Sister   . Throat cancer Maternal Uncle   . Breast cancer Maternal Aunt      History   Social History  . Marital Status: Married    Spouse Name: N/A    Number of Children: N/A  . Years of Education: N/A   Occupational History  . wildlife rescue    Social History Main Topics  . Smoking status: Never Smoker   . Smokeless tobacco: Not on file  . Alcohol Use: No  . Drug Use: No  . Sexually Active: Not on file   Other Topics Concern  . Not on file   Social History Narrative  . No narrative on file     Allergies  Allergen Reactions  . Codeine   . Darvon   . Demerol      No outpatient prescriptions prior to visit.         Objective:   Physical Exam  Gen: Pleasant, well-nourished, in no distress,  normal affect  ENT: No lesions,  mouth clear,  oropharynx clear, no postnasal drip, erythema posterior pharynx  Neck: No JVD, no TMG, no carotid bruits  Lungs: No use of accessory muscles, no dullness to percussion, clear without rales or rhonchi  Cardiovascular: RRR, heart sounds normal, no murmur or gallops, no peripheral edema  Musculoskeletal:  No deformities, no cyanosis or clubbing  Neuro: alert, non focal  Skin: Warm, no lesions or rashes       Assessment & Plan:  Chronic cough Sustained by GERD and PND. Will try to treat both before escalating workup. If her breathing remains problmatic or if I cant control cough, then will get PFT, consider FOB given her trach (? Granulation tissue)

## 2011-05-16 NOTE — Patient Instructions (Signed)
Please start taking Claritin (loratadine) 10mg  every day You may still use benadryl at bedtime as needed Start taking your fluticasone nose spray, 1 spray each nostril, every day Increase your omeprazole 20mg  to twice a day until our next visit You may still use albuterol as needed Try to rest your voice. Refer to our cough info sheet.  Follow with Dr Delton Coombes in 3-4 weeks.

## 2011-06-20 ENCOUNTER — Encounter: Payer: Self-pay | Admitting: Emergency Medicine

## 2011-06-20 ENCOUNTER — Ambulatory Visit (INDEPENDENT_AMBULATORY_CARE_PROVIDER_SITE_OTHER): Payer: BC Managed Care – PPO | Admitting: Emergency Medicine

## 2011-06-20 ENCOUNTER — Institutional Professional Consult (permissible substitution): Payer: Self-pay | Admitting: Emergency Medicine

## 2011-06-20 VITALS — BP 132/88 | HR 80 | Temp 98.2°F | Ht 66.0 in | Wt 143.8 lb

## 2011-06-20 DIAGNOSIS — R05 Cough: Secondary | ICD-10-CM

## 2011-06-20 NOTE — Patient Instructions (Signed)
Please restart loratadine (Claritin) daily.  Try increasing your fluticasone nasal spray to 1 spray in the morning, 2 sprays in the evening.  Continue your omeprazole twice a day.  Start using benadryl 25mg  every night Follow with Dr Delton Coombes if your symptoms worsen in any way

## 2011-06-20 NOTE — Progress Notes (Signed)
  Subjective:    Patient ID: Whitney Potter, female    DOB: [birth year], 66 y.o.   MRN: [id number]  HPI 66 yo woman, never smoker, hx of HTN, hyperlipidemia, trach at age 66 for for narrow airway. Seen by S. Christell Constant, NP, at [facility] for cough and dyspnea.  She reports that about a year ago she started having cough, dry hack. It improved for a couple months but then it returned. No sputum. 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 Has been treated on several occasions w steroids, abx - treated in Feb 2013. Felt chest congestion and tightness but the cough was non-productive. Has been rx w albuterol w mixed results. Has also been tried on tussionex, mucinex. She has heartburn sx, has been on omeprazole but only intermittently, rarely uses flonase spray, only one spray. Not on claritin or benadryl.   ROV 06/20/11 -- 66 yo woman, f/u for chronic cough. Last time we started loratadine, fluticasone, increased omeprazole to bid. Her cough is better, but not gone. She stopped the claritin - only took it intermittently. She decreased flonase to 1 spray bid due to nose bleeding. The increase in omeprazole seemed to help too.      Objective:   Physical Exam  Gen: Pleasant, well-nourished, in no distress,  normal affect  ENT: No lesions,  mouth clear,  oropharynx clear, no postnasal drip, erythema posterior pharynx  Neck: No JVD, no TMG, no carotid bruits  Lungs: No use of accessory muscles, no dullness to percussion, clear without rales or rhonchi  Cardiovascular: RRR, heart sounds normal, no murmur or gallops, no peripheral edema  Musculoskeletal: No deformities, no cyanosis or clubbing  Neuro: alert, non focal  Skin: Warm, no lesions or rashes      Assessment & Plan:  Chronic cough - asked her to go back to loratadine every day - increase fluticasone to 2 sprays bid if she can tolerate, stay on 1 bid if having nose bleeding -  omeprazole bid - add benadryl qhs - no indication for FOB at this time.  - rov prn

## 2011-06-20 NOTE — Assessment & Plan Note (Signed)
-   asked her to go back to loratadine every day - increase fluticasone to 2 sprays bid if she can tolerate, stay on 1 bid if having nose bleeding - omeprazole bid - add benadryl qhs - no indication for FOB at this time.  - rov prn

## 2012-03-29 ENCOUNTER — Encounter (HOSPITAL_BASED_OUTPATIENT_CLINIC_OR_DEPARTMENT_OTHER): Payer: Self-pay | Admitting: Emergency Medicine

## 2012-03-29 ENCOUNTER — Emergency Department (HOSPITAL_BASED_OUTPATIENT_CLINIC_OR_DEPARTMENT_OTHER)
Admission: EM | Admit: 2012-03-29 | Discharge: 2012-03-30 | Disposition: A | Discharge status: Home / Self Care | Payer: Medicare Other | Attending: Emergency Medicine | Admitting: Emergency Medicine

## 2012-03-29 ENCOUNTER — Emergency Department (HOSPITAL_BASED_OUTPATIENT_CLINIC_OR_DEPARTMENT_OTHER): Payer: Medicare Other

## 2012-03-29 DIAGNOSIS — Z79899 Other long term (current) drug therapy: Secondary | ICD-10-CM | POA: Insufficient documentation

## 2012-03-29 DIAGNOSIS — E785 Hyperlipidemia, unspecified: Secondary | ICD-10-CM | POA: Insufficient documentation

## 2012-03-29 DIAGNOSIS — I10 Essential (primary) hypertension: Secondary | ICD-10-CM | POA: Insufficient documentation

## 2012-03-29 DIAGNOSIS — Z8719 Personal history of other diseases of the digestive system: Secondary | ICD-10-CM | POA: Insufficient documentation

## 2012-03-29 DIAGNOSIS — Z7982 Long term (current) use of aspirin: Secondary | ICD-10-CM | POA: Insufficient documentation

## 2012-03-29 LAB — CBC
HCT: 38.5 % (ref 36.0–46.0)
Hemoglobin: 13.2 g/dL (ref 12.0–15.0)
MCH: 30.2 pg (ref 26.0–34.0)
MCHC: 34.3 g/dL (ref 30.0–36.0)
MCV: 88.1 fL (ref 78.0–100.0)

## 2012-03-29 MED ORDER — ASPIRIN 81 MG PO CHEW
324.0000 mg | CHEWABLE_TABLET | Freq: Once | ORAL | Status: AC
  Administered 2012-03-29: 324 mg via ORAL
  Filled 2012-03-29: qty 4

## 2012-03-29 NOTE — ED Notes (Signed)
Patient back from  X-ray 

## 2012-03-29 NOTE — ED Provider Notes (Signed)
History     CSN: [id number]  Arrival date & time 03/29/12  2147   First MD Initiated Contact with Patient 03/29/12 2313      Chief Complaint  Patient presents with  . Hypertension    (Consider location/radiation/quality/duration/timing/severity/associated sxs/prior treatment) HPI Pt presenting with concern for elevated blood pressure.  She states she has hx of reflux and has been having burning pain in upper stomach and chest similar to her prior gerd.  Takes omeprazole for this.  She took her blood pressure tonight and it was elevated in the 160s.  This prompted her to go the fire department where it was 190/118.  They advised her to come to the ED.  No sob, no nausea, no leg swelling. No chest pressure or tightness.  Pt has been trying tums today without much relief.  There are no other associated systemic symptoms, there are no other alleviating or modifying factors.   Past Medical History  Diagnosis Date  . Hypertension   . Hyperlipidemia   . H/O tracheostomy     Past Surgical History  Procedure Date  . Tracheostomy   . Appendectomy   . Breast lumpectomy   . Dilation and curettage of uterus     Family History  Problem Relation Age of Onset  . COPD Mother   . Heart attack Paternal Grandmother   . Heart attack Maternal Aunt   . Kidney cancer Paternal Grandfather   . Liver cancer Paternal Aunt   . Colon cancer Paternal Uncle   . Lung cancer Sister   . Throat cancer Maternal Uncle   . Breast cancer Maternal Aunt     History  Substance Use Topics  . Smoking status: Never Smoker   . Smokeless tobacco: Not on file  . Alcohol Use: No    OB History    Grav Para Term Preterm Abortions TAB SAB Ect Mult Living                  Review of Systems ROS reviewed and all otherwise negative except for mentioned in HPI  Allergies  Codeine; Darvon; and Demerol  Home Medications   Current Outpatient Rx  Name  Route  Sig  Dispense  Refill  . OMEGA-3 FATTY ACIDS 1000  MG PO CAPS   Oral   Take 2 g by mouth daily.         . ASPIRIN 81 MG PO TABS   Oral   Take 81 mg by mouth every other day.          Marland Kitchen BISOPROLOL-HYDROCHLOROTHIAZIDE 5-6.25 MG PO TABS      1 by mouth daily         . CALCIUM CARB-CHOLECALCIFEROL 1000-800 MG-UNIT PO TABS   Oral   Take 1 tablet by mouth every other day.         . CRESTOR 20 MG PO TABS      1 by mouth daily         . FENOFIBRATE 48 MG PO TABS      1 by mouth daily         . FLAXSEED (LINSEED) 1300 MG PO CAPS   Oral   Take 1 capsule by mouth daily.         Marland Kitchen FLUTICASONE PROPIONATE 50 MCG/ACT NA SUSP   Nasal   Place 2 sprays into the nose 2 (two) times daily. 2 sprays in each nostril daily         . GLUCOSAMINE-CHONDROITIN-VIT  D3 1500-1200-800 MG-MG-UNIT PO PACK   Oral   Take 1 tablet by mouth every other day.         Marland Kitchen HORSE CHESTNUT PO   Oral   Take 1 tablet by mouth daily.         . LUTEIN-ZEAXANTHIN 25-5 MG PO CAPS   Oral   Take 1 capsule by mouth daily.         Marland Kitchen JUICE PLUS FIBRE PO   Oral   Take 2 capsules by mouth daily.         Marland Kitchen OMEPRAZOLE 20 MG PO CPDR   Oral   Take 40 mg by mouth 2 (two) times daily.          Marland Kitchen PROBIOTIC FORMULA PO   Oral   Take 1 tablet by mouth daily.         Marland Kitchen VITAMIN E 400 UNITS PO CAPS   Oral   Take 400 Units by mouth daily.           BP 145/71  Pulse 70  Temp 97.7 F (36.5 C) (Oral)  Resp 19  Ht 5\' 6"  (1.676 m)  Wt 143 lb (64.864 kg)  BMI 23.08 kg/m2  SpO2 99% Vitals reviewed Physical Exam Physical Examination: General appearance - alert, mildly anxious, well appearing, and in no distress Mental status - alert, oriented to person, place, and time Eyes - no conjunctival injection, no scleral icterus Mouth - mucous membranes moist, pharynx normal without lesions Chest - clear to auscultation, no wheezes, rales or rhonchi, symmetric air entry Heart - normal rate, regular rhythm, normal S1, S2, no murmurs, rubs, clicks  or gallops Abdomen - soft, nontender, nondistended, no masses or organomegaly, nabs Extremities - peripheral pulses normal, no pedal edema, no clubbing or cyanosis Skin - normal coloration and turgor, no rashes  ED Course  Procedures (including critical care time)   Date: 03/29/2012  Rate: 68  Rhythm: normal sinus rhythm  QRS Axis: normal  Intervals: normal  ST/T Wave abnormalities: normal  Conduction Disutrbances: none  Narrative Interpretation: unremarkable, no sig changes from prior ekg of 03/30/06     Labs Reviewed  COMPREHENSIVE METABOLIC PANEL - Abnormal; Notable for the following:    Glucose, Bld 109 (*)     BUN 28 (*)     Creatinine, Ser 1.30 (*)     GFR calc non Af Amer 42 (*)     GFR calc Af Amer 49 (*)     All other components within normal limits  CBC  TROPONIN I  TROPONIN I   Dg Chest 2 View  03/29/2012  *RADIOLOGY REPORT*  Clinical Data: Elevated blood pressure.  Reflux.  Shortness of breath.  CHEST - 2 VIEW  Comparison: 04/15/2006  Findings: Slight fibrosis or linear atelectasis in the left lung base. The heart size and pulmonary vascularity are normal. The lungs appear clear and expanded without focal air space disease or consolidation. No blunting of the costophrenic angles.  No pneumothorax.  Mediastinal contours appear intact.  No significant change since previous study.  IMPRESSION: No evidence of active pulmonary disease.   Original Report Authenticated By: Burman Nieves, M.D.      1. Hypertension       MDM  Pt with concern for hypertension and GERD.  Due to possibility of ACS- pt has had EKG- normal and 2 sets of troponins which were both negative.  CXR also reassuring.  No sign of end organ damage- except for mildly  elevated creatinine- last comparison level is approx 5 years prior.  I think her symptoms today do represent more of a gerd type picture- given the location in epigastrium, burning nature and similar symptoms previously.  Also with  reassuring workup in the ED.  Discharged with strict return precautions.  Pt agreeable with plan.        Ethelda Chick, MD 03/30/12 [phone]

## 2012-03-29 NOTE — ED Notes (Signed)
Indigestion since yesterday.  Elevated BP today, 165/118. She c/o left temporal h/a.  [place] near the fire department and also went there to have her BP checked.  BP at fire department was 190/118.  Also c/o SHOB.  She took her BP meds. today.  Has taken TUMS with minimal relief.

## 2012-03-30 LAB — COMPREHENSIVE METABOLIC PANEL
Alkaline Phosphatase: 82 U/L (ref 39–117)
BUN: 28 mg/dL — ABNORMAL HIGH (ref 6–23)
Calcium: 10.5 mg/dL (ref 8.4–10.5)
GFR calc Af Amer: 49 mL/min — ABNORMAL LOW (ref >90)
Glucose, Bld: 109 mg/dL — ABNORMAL HIGH (ref 70–99)
Total Protein: 6.7 g/dL (ref 6.0–8.3)

## 2012-03-30 LAB — TROPONIN I: Troponin I: <0.3 ng/mL (ref <0.30)

## 2012-03-30 MED ORDER — GI COCKTAIL ~~LOC~~
30.0000 mL | Freq: Once | ORAL | Status: AC
  Administered 2012-03-30: 30 mL via ORAL
  Filled 2012-03-30: qty 30

## 2012-03-30 MED ORDER — SODIUM CHLORIDE 0.9 % IV BOLUS (SEPSIS)
1000.0000 mL | Freq: Once | INTRAVENOUS | Status: AC
  Administered 2012-03-30: 1000 mL via INTRAVENOUS

## 2012-12-17 ENCOUNTER — Encounter (HOSPITAL_BASED_OUTPATIENT_CLINIC_OR_DEPARTMENT_OTHER): Payer: Self-pay | Admitting: *Deleted

## 2012-12-17 ENCOUNTER — Emergency Department (HOSPITAL_BASED_OUTPATIENT_CLINIC_OR_DEPARTMENT_OTHER)
Admission: EM | Admit: 2012-12-17 | Discharge: 2012-12-17 | Disposition: A | Discharge status: Home / Self Care | Payer: Medicare Other | Attending: Emergency Medicine | Admitting: Emergency Medicine

## 2012-12-17 DIAGNOSIS — I10 Essential (primary) hypertension: Secondary | ICD-10-CM | POA: Insufficient documentation

## 2012-12-17 DIAGNOSIS — R55 Syncope and collapse: Secondary | ICD-10-CM | POA: Insufficient documentation

## 2012-12-17 DIAGNOSIS — Z79899 Other long term (current) drug therapy: Secondary | ICD-10-CM | POA: Insufficient documentation

## 2012-12-17 DIAGNOSIS — R112 Nausea with vomiting, unspecified: Secondary | ICD-10-CM | POA: Insufficient documentation

## 2012-12-17 DIAGNOSIS — Z7982 Long term (current) use of aspirin: Secondary | ICD-10-CM | POA: Insufficient documentation

## 2012-12-17 DIAGNOSIS — IMO0002 Reserved for concepts with insufficient information to code with codable children: Secondary | ICD-10-CM | POA: Insufficient documentation

## 2012-12-17 DIAGNOSIS — R5381 Other malaise: Secondary | ICD-10-CM | POA: Insufficient documentation

## 2012-12-17 DIAGNOSIS — E785 Hyperlipidemia, unspecified: Secondary | ICD-10-CM | POA: Insufficient documentation

## 2012-12-17 LAB — URINALYSIS, ROUTINE W REFLEX MICROSCOPIC
Hgb urine dipstick: NEGATIVE
Leukocytes, UA: NEGATIVE
Nitrite: NEGATIVE
Specific Gravity, Urine: 1.02 (ref 1.005–1.030)
Urobilinogen, UA: 0.2 mg/dL (ref 0.0–1.0)

## 2012-12-17 LAB — BASIC METABOLIC PANEL
GFR calc Af Amer: 76 mL/min — ABNORMAL LOW (ref >90)
GFR calc non Af Amer: 65 mL/min — ABNORMAL LOW (ref >90)
Glucose, Bld: 133 mg/dL — ABNORMAL HIGH (ref 70–99)
Potassium: 4.5 mEq/L (ref 3.5–5.1)
Sodium: 142 mEq/L (ref 135–145)

## 2012-12-17 LAB — CBC WITH DIFFERENTIAL/PLATELET
Basophils Absolute: 0 10*3/uL (ref 0.0–0.1)
Eosinophils Absolute: 0 10*3/uL (ref 0.0–0.7)
Lymphs Abs: 1.9 10*3/uL (ref 0.7–4.0)
MCH: 31 pg (ref 26.0–34.0)
Neutrophils Relative %: 71 % (ref 43–77)
Platelets: 190 10*3/uL (ref 150–400)
RBC: 4.2 MIL/uL (ref 3.87–5.11)
WBC: 9 10*3/uL (ref 4.0–10.5)

## 2012-12-17 MED ORDER — SODIUM CHLORIDE 0.9 % IV BOLUS (SEPSIS)
1000.0000 mL | Freq: Once | INTRAVENOUS | Status: AC
  Administered 2012-12-17: 1000 mL via INTRAVENOUS

## 2012-12-17 MED ORDER — METOCLOPRAMIDE HCL 5 MG/ML IJ SOLN
INTRAMUSCULAR | Status: AC
  Administered 2012-12-17: 10 mg via INTRAVENOUS
  Filled 2012-12-17: qty 2

## 2012-12-17 MED ORDER — METOCLOPRAMIDE HCL 5 MG/ML IJ SOLN
10.0000 mg | Freq: Once | INTRAMUSCULAR | Status: AC
  Administered 2012-12-17: 10 mg via INTRAVENOUS

## 2012-12-17 MED ORDER — ONDANSETRON HCL 4 MG/2ML IJ SOLN: 4.0000 mg | Freq: Once | INTRAMUSCULAR | Status: DC

## 2012-12-17 NOTE — ED Notes (Signed)
EMS reports that the patient gave blood today at 1700, which she does on a frequent basis.  States she left the blood center and was at her Vet's office, when she was leaving, she had a brief syncopal episode .  EMS reports that first responders had to place pt in trendelenburg position to get a pulse and was then able to palpable a bp of 108.  CBG 189.  18 angiocath inserted in right hand and 500 cc NS given.  Zofran 4 mg iv given x 2 for vomiting x 2.   No pain.

## 2012-12-17 NOTE — ED Notes (Signed)
Patient diaphoretic and nauseous for standing orthostatic. Assigned RN at bedside

## 2012-12-17 NOTE — ED Notes (Signed)
Pt too weak to perform Orthostatics at this time.  MD aware.  Will try later.

## 2012-12-17 NOTE — ED Notes (Signed)
Patient unable to tolerate orthostatic vital signs at this time. MD aware and will try again.

## 2012-12-17 NOTE — ED Provider Notes (Signed)
CSN: [id number]     Arrival date & time 12/17/12  1858 History  This chart was scribed for Charles B. Bernette Mayers, MD by Dorothey Baseman, ED Scribe. This patient was seen in room MH06/MH06 and the patient's care was started at 7:17 PM.    Chief Complaint  Patient presents with  . Near Syncope   The history is provided by the patient. No language interpreter was used.   HPI Comments: Whitney Potter is a 67 y.o. female who presents to the Emergency Department complaining of a near syncopal episode with associated nausea, emesis, weakness, and chills. She denies falling or hitting her head. Patient reports that she donated blood around 2 hours ago and that she received snacks and fluids at the time. She denies caffeine use or smoking. Patient reports a history of HTN.   Past Medical History  Diagnosis Date  . Hypertension   . Hyperlipidemia   . H/O tracheostomy    Past Surgical History  Procedure Laterality Date  . Tracheostomy    . Appendectomy    . Breast lumpectomy    . Dilation and curettage of uterus     Family History  Problem Relation Age of Onset  . COPD Mother   . Heart attack Paternal Grandmother   . Heart attack Maternal Aunt   . Kidney cancer Paternal Grandfather   . Liver cancer Paternal Aunt   . Colon cancer Paternal Uncle   . Lung cancer Sister   . Throat cancer Maternal Uncle   . Breast cancer Maternal Aunt    History  Substance Use Topics  . Smoking status: Never Smoker   . Smokeless tobacco: Not on file  . Alcohol Use: No   OB History   Grav Para Term Preterm Abortions TAB SAB Ect Mult Living                 Review of Systems  A complete 10 system review of systems was obtained and all systems are negative except as noted in the HPI and PMH.   Allergies  Codeine; Darvon; and Demerol  Home Medications   Current Outpatient Rx  Name  Route  Sig  Dispense  Refill  . aspirin 81 MG tablet   Oral   Take 81 mg by mouth every other day.          .  bisoprolol-hydrochlorothiazide (ZIAC) 5-6.25 MG per tablet      1 by mouth daily         . Calcium Carb-Cholecalciferol (CALCIUM 1000 + D) 1000-800 MG-UNIT TABS   Oral   Take 1 tablet by mouth every other day.         . CRESTOR 20 MG tablet      1 by mouth daily         . fenofibrate (TRICOR) 48 MG tablet      1 by mouth daily         . fish oil-omega-3 fatty acids 1000 MG capsule   Oral   Take 2 g by mouth daily.         . Flaxseed, Linseed, 1300 MG CAPS   Oral   Take 1 capsule by mouth daily.         . fluticasone (FLONASE) 50 MCG/ACT nasal spray   Nasal   Place 2 sprays into the nose 2 (two) times daily. 2 sprays in each nostril daily         . Glucosamine-Chondroitin-Vit D3 1500-1200-800 MG-MG-UNIT PACK  Oral   Take 1 tablet by mouth every other day.         Marland Kitchen HORSE CHESTNUT PO   Oral   Take 1 tablet by mouth daily.         . Lutein-Zeaxanthin 25-5 MG CAPS   Oral   Take 1 capsule by mouth daily.         . Nutritional Supplements (JUICE PLUS FIBRE PO)   Oral   Take 2 capsules by mouth daily.         Marland Kitchen omeprazole (PRILOSEC) 20 MG capsule   Oral   Take 40 mg by mouth 2 (two) times daily.          . Probiotic Product (PROBIOTIC FORMULA PO)   Oral   Take 1 tablet by mouth daily.         . vitamin E 400 UNIT capsule   Oral   Take 400 Units by mouth daily.          Triage Vitals: BP 140/81  Pulse 68  Temp(Src) 96.1 F (35.6 C) (Rectal)  Resp 25  SpO2 99%  Physical Exam  Nursing note and vitals reviewed. Constitutional: She is oriented to person, place, and time. She appears well-developed and well-nourished.  HENT:  Head: Normocephalic and atraumatic.  Mouth appears dry.   Eyes: EOM are normal. Pupils are equal, round, and reactive to light.  Neck: Normal range of motion. Neck supple.  Cardiovascular: Normal rate, normal heart sounds and intact distal pulses.   Pulmonary/Chest: Effort normal and breath sounds normal.   Musculoskeletal: Normal range of motion. She exhibits no edema and no tenderness.  Neurological: She is alert and oriented to person, place, and time. She has normal strength. No cranial nerve deficit or sensory deficit.  Skin: Skin is warm and dry. No rash noted.  Psychiatric: She has a normal mood and affect.    ED Course  Procedures (including critical care time)  Medications  sodium chloride 0.9 % bolus 1,000 mL (1,000 mLs Intravenous New Bag/Given 12/17/12 2038)  sodium chloride 0.9 % bolus 1,000 mL (0 mLs Intravenous Stopped 12/17/12 2020)  metoCLOPramide (REGLAN) injection 10 mg (10 mg Intravenous Given 12/17/12 2048)    DIAGNOSTIC STUDIES: Oxygen Saturation is 99% on room air, normal by my interpretation.    COORDINATION OF CARE: 7:19PM- Will order blood labs, Zofran, and IV fluids. Discussed treatment plan with patient at bedside and patient verbalized agreement.     Labs Review Labs Reviewed  BASIC METABOLIC PANEL - Abnormal; Notable for the following:    Glucose, Bld 133 (*)    BUN 28 (*)    GFR calc non Af Amer 65 (*)    GFR calc Af Amer 76 (*)    All other components within normal limits  URINALYSIS, ROUTINE W REFLEX MICROSCOPIC - Abnormal; Notable for the following:    APPearance CLOUDY (*)    All other components within normal limits  CBC WITH DIFFERENTIAL   Imaging Review No results found.  MDM   1. Syncope     Labs reviewed and unremarkable. Pt feeling better after 2L normal saline, sitting up and talking. Will attempt ambulation and then anticipate discharge.   I personally performed the services described in this documentation, which was scribed in my presence. The recorded information has been reviewed and is accurate.      Charles B. Bernette Mayers, MD 12/17/12 2151

## 2014-02-09 IMAGING — CR DG CHEST 2V
2 series · 2 of 2 positions shown · non-contrast
Comparison: 04/15/2006

CLINICAL DATA: Elevated blood pressure.  Reflux.  Shortness of
breath.

CHEST - 2 VIEW

[w chest pa]
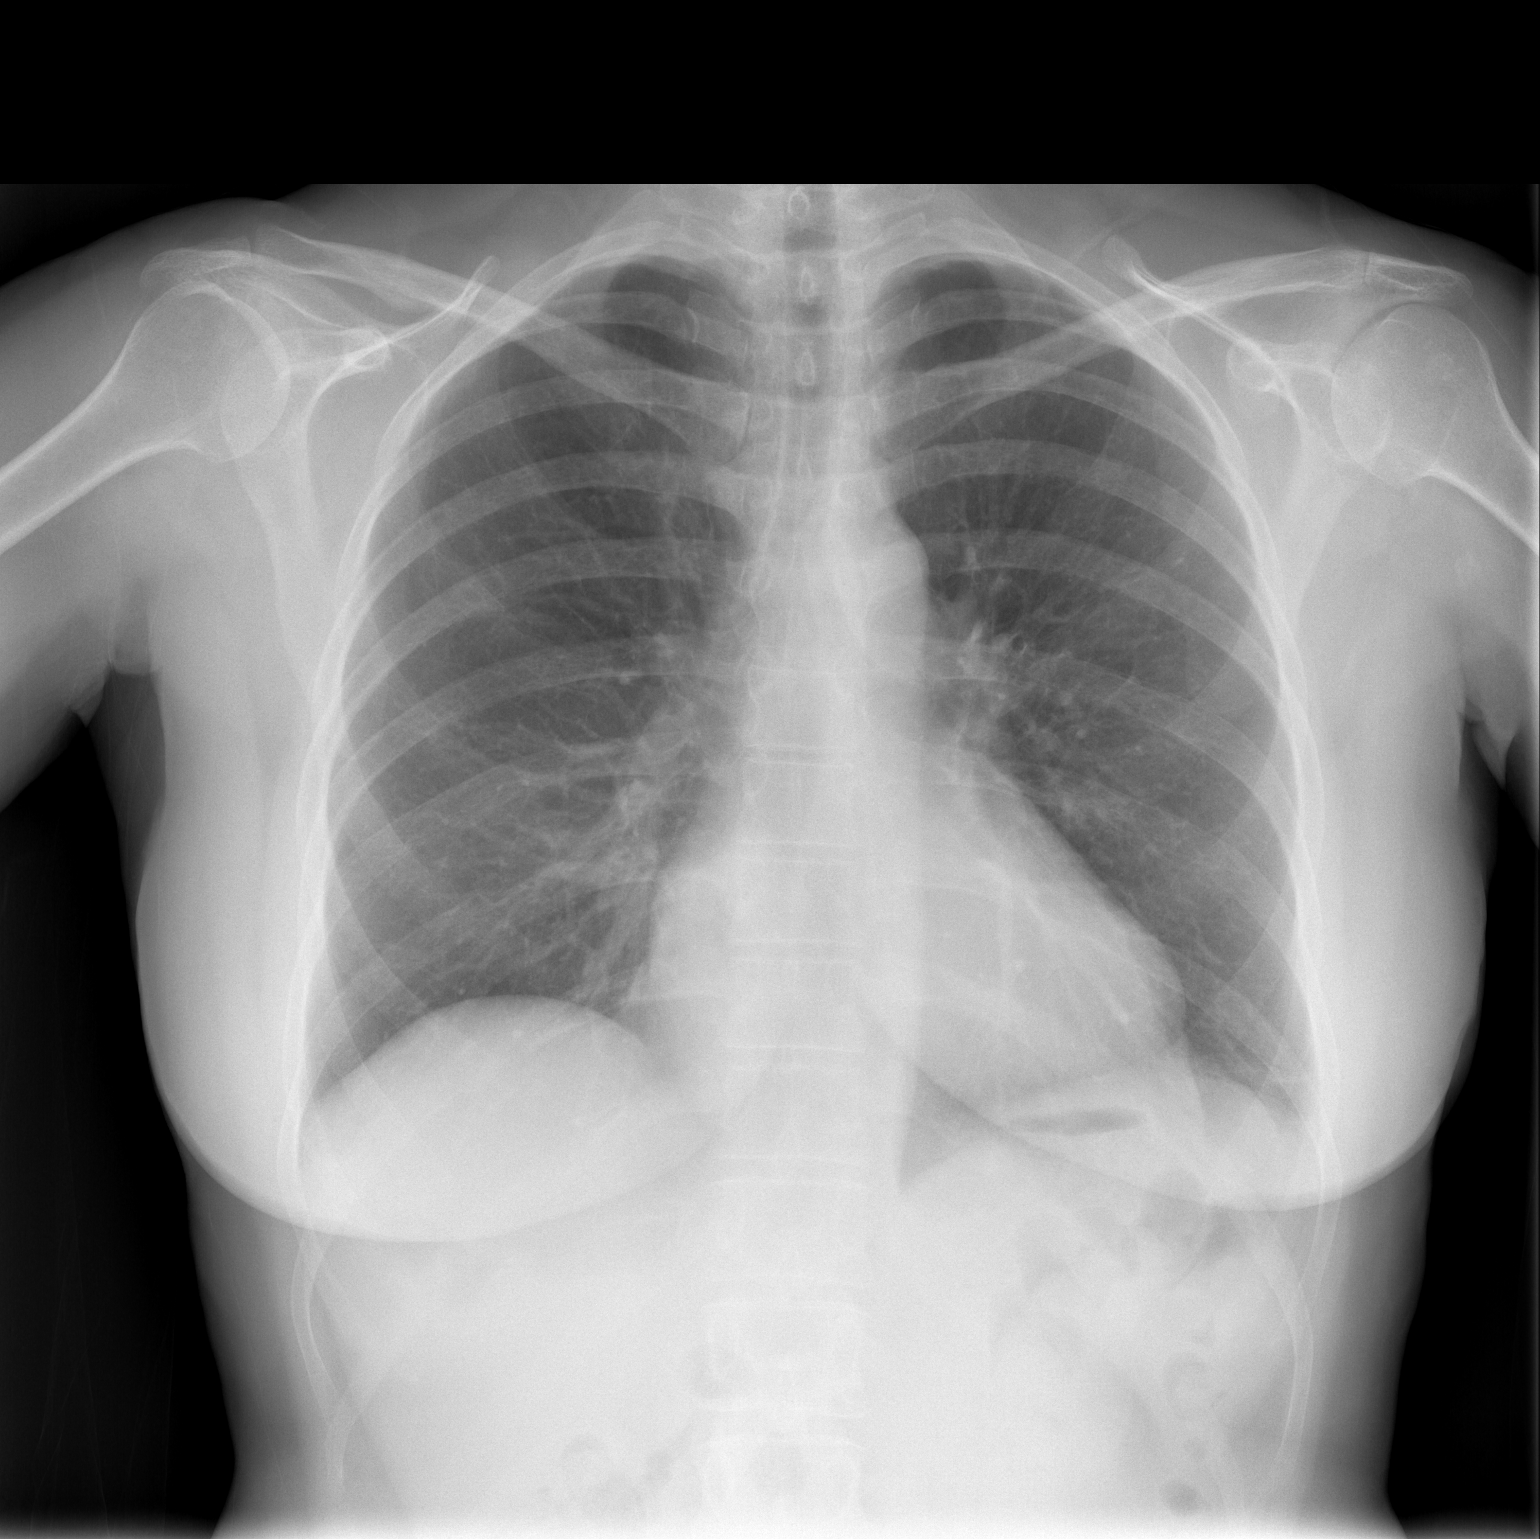

[w chest lat]
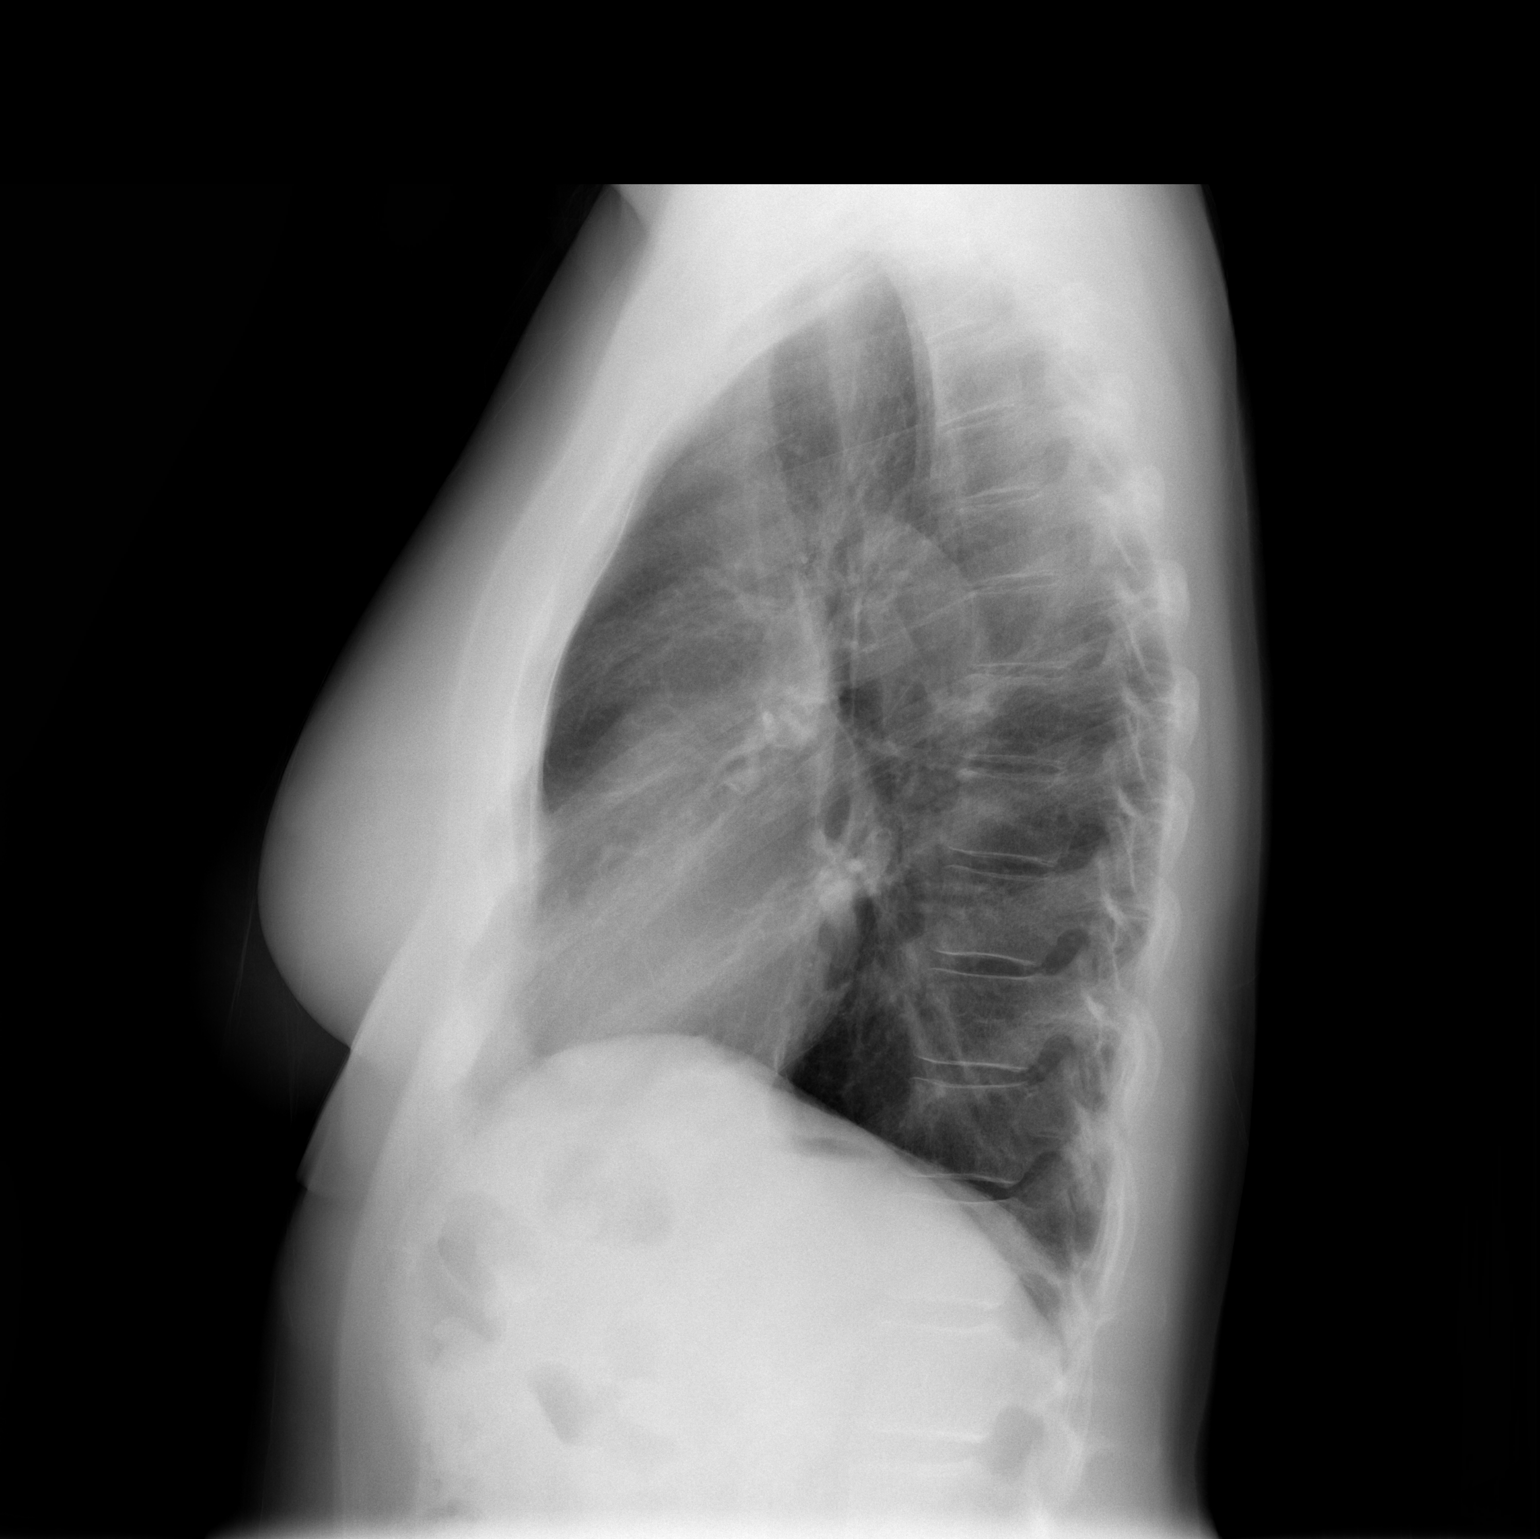

[2 of 2 positions shown; findings below may reference images not displayed]

FINDINGS: Slight fibrosis or linear atelectasis in the left lung
base. The heart size and pulmonary vascularity are normal. The
lungs appear clear and expanded without focal air space disease or
consolidation. No blunting of the costophrenic angles.  No
pneumothorax.  Mediastinal contours appear intact.  No significant
change since previous study.
IMPRESSION: No evidence of active pulmonary disease.

## 2014-12-22 ENCOUNTER — Telehealth: Payer: Self-pay | Admitting: Cardiovascular Disease

## 2014-12-22 ENCOUNTER — Ambulatory Visit (INDEPENDENT_AMBULATORY_CARE_PROVIDER_SITE_OTHER): Payer: Medicare Other | Admitting: Cardiology

## 2014-12-22 ENCOUNTER — Encounter: Payer: Self-pay | Admitting: Cardiology

## 2014-12-22 ENCOUNTER — Ambulatory Visit: Payer: Medicare Other | Admitting: Cardiology

## 2014-12-22 ENCOUNTER — Telehealth: Payer: Self-pay

## 2014-12-22 VITALS — BP 130/70 | HR 72 | Ht 66.0 in | Wt 139.3 lb

## 2014-12-22 DIAGNOSIS — E785 Hyperlipidemia, unspecified: Secondary | ICD-10-CM

## 2014-12-22 DIAGNOSIS — I10 Essential (primary) hypertension: Secondary | ICD-10-CM | POA: Diagnosis not present

## 2014-12-22 DIAGNOSIS — I491 Atrial premature depolarization: Secondary | ICD-10-CM | POA: Insufficient documentation

## 2014-12-22 NOTE — Patient Instructions (Signed)
Continue your current therapy  I will see you as needed. 

## 2014-12-22 NOTE — Telephone Encounter (Signed)
[name] is calling in stating that the pt had an appt today and she had an abnormal EKG. [name] the PA is wanting her to f/u with a cardiologist today if possible.   Thanks

## 2014-12-22 NOTE — Telephone Encounter (Signed)
Dr.Jordan's 12/22/14 office note faxed to [name] PA at [facility] at fax # [phone].

## 2014-12-22 NOTE — Telephone Encounter (Signed)
Rene Kocher our scheduler returned call to [place] at [facility] office.Appointment scheduled with Dr.Jordan this afternoon 10/11 at 3:45 pm.

## 2014-12-22 NOTE — Progress Notes (Signed)
Cardiology Office Note   Date:  12/22/2014    ID:  Whitney Potter, DOB May 16, 1945, MRN [id number]  PCP:  Whitney Organ., MD  Cardiologist:   Whitney Swaziland, MD   Chief Complaint  Patient presents with  . New Evaluation    pt states something feels like "very heavy wet towels flopping around in a dryer" in chest      History of Present Illness: Whitney Potter is a 69 y.o. female who presents for evaluation of palpitations at the request of Whitney Britain PA at [employer]. She reports that over the past 3 days she has experienced palpitations. She feels her heart flopping and beating hard. It is more noticeable when she lies on her left side. No dizziness, syncope, or chest pain. No SOB. She has been on bisoprolol chronically for HTN. Denies use of caffeine, decongestants, or tobacco. Does not drink alcohol.  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 Subsequent LV function returned to normal. She had a normal Myoview study in 2004. She was seen in 2009 with similar palpitations and Echo was normal. A holter monitor was done but I cannot find results. She has a history of combined hyperlipidemia. She has been on Crestor but recently switched to red yeast rice. She is taking fish oil and fenofibrate.     Past Medical History  Diagnosis Date  . Hypertension   . Hyperlipidemia   . H/O tracheostomy (HCC)   . PAC (premature atrial contraction)     Past Surgical History  Procedure Laterality Date  . Tracheostomy    . Appendectomy    . Breast lumpectomy    . Dilation and curettage of uterus       Current Outpatient Prescriptions  Medication Sig Dispense Refill  . Ascorbic Acid (VITAMIN C) 500 MG CAPS Take 2 tablets by mouth daily.    Marland Kitchen aspirin 81 MG tablet Take 81 mg by mouth every other day.     . B Complex-C (B-COMPLEX WITH VITAMIN C) tablet Take 1 tablet by mouth daily.    . bisoprolol (ZEBETA) 5 MG tablet Take 5 mg by mouth daily.    . Calcium  Carb-Cholecalciferol (CALCIUM 1000 + D) 1000-800 MG-UNIT TABS Take 1 tablet by mouth every other day.    Marland Kitchen co-enzyme Q-10 50 MG capsule Take 50 mg by mouth daily.    . fenofibrate (TRICOR) 48 MG tablet 1 by mouth daily    . fish oil-omega-3 fatty acids 1000 MG capsule Take 2 g by mouth daily.    . Flaxseed, Linseed, 1300 MG CAPS Take 1 capsule by mouth daily.    . fluticasone (FLONASE) 50 MCG/ACT nasal spray Place 2 sprays into the nose 2 (two) times daily. 2 sprays in each nostril daily    . Glucosamine-Chondroitin-Vit D3 1500-1200-800 MG-MG-UNIT PACK Take 1 tablet by mouth every other day.    Marland Kitchen HORSE CHESTNUT PO Take 1 tablet by mouth daily.    . Lutein-Zeaxanthin 25-5 MG CAPS Take 1 capsule by mouth daily.    Marland Kitchen MAGNESIUM CL-CALCIUM CARBONATE PO Take 1 tablet by mouth daily.    . Nutritional Supplements (JUICE PLUS FIBRE PO) Take 2 capsules by mouth daily.    Marland Kitchen omeprazole (PRILOSEC) 20 MG capsule Take 40 mg by mouth 2 (two) times daily.     . Probiotic Product (PROBIOTIC FORMULA PO) Take 1 tablet by mouth daily.    . Red Yeast Rice Extract 600 MG CAPS  Take 1,200 mg by mouth 2 (two) times daily.    . vitamin E 400 UNIT capsule Take 400 Units by mouth daily.     No current facility-administered medications for this visit.    Allergies:   Codeine; Darvon; and Demerol    Social History:  The patient  reports that she has never smoked. She does not have any smokeless tobacco history on file. She reports that she does not drink alcohol or use illicit drugs.   Family History:  The patient's family history includes Breast cancer in her maternal aunt; COPD in her mother; Colon cancer in her paternal uncle; Heart attack in her maternal aunt and paternal grandmother; Kidney cancer in her paternal grandfather; Liver cancer in her paternal aunt; Lung cancer in her sister; Throat cancer in her maternal uncle.    ROS:  Please see the history of present illness.   Otherwise, review of systems are  positive for none.   All other systems are reviewed and negative.    PHYSICAL EXAM: VS:  BP 130/70 mmHg  Pulse 72  Ht  (1.676 m)  Wt 63.186 kg (139 lb 4.8 oz)  BMI 22.49 kg/m2 , BMI Body mass index is 22.49 kg/(m^2). GEN: Well nourished, well developed, in no acute distress HEENT: normal Neck: no JVD, carotid bruits, or masses Cardiac: RRR; no murmurs, rubs, or gallops,no edema  Respiratory:  clear to auscultation bilaterally, normal work of breathing GI: soft, nontender, nondistended, + BS MS: no deformity or atrophy Skin: warm and dry, no rash Neuro:  Strength and sensation are intact Psych: euthymic mood, full affect   EKG:  EKG is ordered today. The ekg ordered today demonstrates NSR with rate 72. Normal Ecg.  EKG done at [facility] shows NSR with PACs in pattern of bigeminy.   Recent Labs: No results found for requested labs within last 365 days.    Lipid Panel    Component Value Date/Time   CHOL 218* 01/08/2008 1203   TRIG 247* 01/08/2008 1203   HDL 41.1 01/08/2008 1203   CHOLHDL 5.3 CALC 01/08/2008 1203   VLDL 49* 01/08/2008 1203   LDLDIRECT 107.6 01/08/2008 1203      Wt Readings from Last 3 Encounters:  12/22/14 63.186 kg (139 lb 4.8 oz)  03/29/12 64.864 kg (143 lb)  06/20/11 65.227 kg (143 lb 12.8 oz)      Other studies Reviewed: Additional studies/ records that were reviewed today include: none. Review of the above records demonstrates: N/A   ASSESSMENT AND PLAN:  1.  PACs with bigeminy. Symptomatic but no major symptoms. Extensive cardiac evaluation in past with normal stress tests and Echo. No clear triggers. Explained that this is a benign arrhythmia. Discussed avoidance of stimulants which she is already doing. Recommend regular aerobic exercise. She is already on beta blocker therapy. She does not require further testing. She is relieved that this is not serious. Will follow up as needed.   2. HTN controlled.  3. Combined hyperlipidemia.  Under Rx with Whitney Potter.    Current medicines are reviewed at length with the patient today.  The patient does not have concerns regarding medicines.  The following changes have been made:  no change  Labs/ tests ordered today include:  Orders Placed This Encounter  Procedures  . EKG 12-Lead     Disposition:   FU with Dr. Swaziland prn  Signed, Whitney Swaziland, MD  12/22/2014 5:24 PM    [facility] [address]  [place], [place], [address] Phone [phone], Fax [phone]

## 2014-12-31 ENCOUNTER — Encounter: Payer: Self-pay | Admitting: Cardiovascular Disease

## 2015-01-19 ENCOUNTER — Ambulatory Visit: Payer: Medicare Other | Admitting: Cardiovascular Disease

## 2015-04-07 DIAGNOSIS — R300 Dysuria: Secondary | ICD-10-CM | POA: Diagnosis not present

## 2015-04-07 DIAGNOSIS — N3090 Cystitis, unspecified without hematuria: Secondary | ICD-10-CM | POA: Diagnosis not present

## 2015-05-03 DIAGNOSIS — K219 Gastro-esophageal reflux disease without esophagitis: Secondary | ICD-10-CM | POA: Diagnosis not present

## 2015-05-03 DIAGNOSIS — M25512 Pain in left shoulder: Secondary | ICD-10-CM | POA: Diagnosis not present

## 2015-05-03 DIAGNOSIS — G8929 Other chronic pain: Secondary | ICD-10-CM | POA: Diagnosis not present

## 2015-05-04 DIAGNOSIS — M25512 Pain in left shoulder: Secondary | ICD-10-CM | POA: Diagnosis not present

## 2015-05-04 DIAGNOSIS — M19012 Primary osteoarthritis, left shoulder: Secondary | ICD-10-CM | POA: Diagnosis not present

## 2015-05-04 DIAGNOSIS — G8929 Other chronic pain: Secondary | ICD-10-CM | POA: Diagnosis not present

## 2015-05-10 DIAGNOSIS — M25512 Pain in left shoulder: Secondary | ICD-10-CM | POA: Diagnosis not present

## 2015-05-10 DIAGNOSIS — M542 Cervicalgia: Secondary | ICD-10-CM | POA: Diagnosis not present

## 2015-05-10 DIAGNOSIS — M25561 Pain in right knee: Secondary | ICD-10-CM | POA: Diagnosis not present

## 2015-05-10 DIAGNOSIS — M7552 Bursitis of left shoulder: Secondary | ICD-10-CM | POA: Diagnosis not present

## 2015-05-13 DIAGNOSIS — R1013 Epigastric pain: Secondary | ICD-10-CM | POA: Diagnosis not present

## 2015-05-13 DIAGNOSIS — R079 Chest pain, unspecified: Secondary | ICD-10-CM | POA: Diagnosis not present

## 2015-05-13 DIAGNOSIS — K219 Gastro-esophageal reflux disease without esophagitis: Secondary | ICD-10-CM | POA: Diagnosis not present

## 2015-05-20 DIAGNOSIS — R202 Paresthesia of skin: Secondary | ICD-10-CM | POA: Diagnosis not present

## 2015-05-20 DIAGNOSIS — M25512 Pain in left shoulder: Secondary | ICD-10-CM | POA: Diagnosis not present

## 2015-05-20 DIAGNOSIS — R200 Anesthesia of skin: Secondary | ICD-10-CM | POA: Diagnosis not present

## 2015-05-27 DIAGNOSIS — R202 Paresthesia of skin: Secondary | ICD-10-CM | POA: Diagnosis not present

## 2015-05-27 DIAGNOSIS — M25512 Pain in left shoulder: Secondary | ICD-10-CM | POA: Diagnosis not present

## 2015-05-27 DIAGNOSIS — R200 Anesthesia of skin: Secondary | ICD-10-CM | POA: Diagnosis not present

## 2015-05-31 DIAGNOSIS — M25512 Pain in left shoulder: Secondary | ICD-10-CM | POA: Diagnosis not present

## 2015-05-31 DIAGNOSIS — R200 Anesthesia of skin: Secondary | ICD-10-CM | POA: Diagnosis not present

## 2015-05-31 DIAGNOSIS — R202 Paresthesia of skin: Secondary | ICD-10-CM | POA: Diagnosis not present

## 2015-06-01 DIAGNOSIS — Z86010 Personal history of colonic polyps: Secondary | ICD-10-CM | POA: Diagnosis not present

## 2015-06-01 DIAGNOSIS — K219 Gastro-esophageal reflux disease without esophagitis: Secondary | ICD-10-CM | POA: Diagnosis not present

## 2015-06-07 DIAGNOSIS — M25512 Pain in left shoulder: Secondary | ICD-10-CM | POA: Diagnosis not present

## 2015-06-07 DIAGNOSIS — R202 Paresthesia of skin: Secondary | ICD-10-CM | POA: Diagnosis not present

## 2015-06-07 DIAGNOSIS — R200 Anesthesia of skin: Secondary | ICD-10-CM | POA: Diagnosis not present

## 2015-06-16 DIAGNOSIS — M25512 Pain in left shoulder: Secondary | ICD-10-CM | POA: Diagnosis not present

## 2015-06-16 DIAGNOSIS — M542 Cervicalgia: Secondary | ICD-10-CM | POA: Diagnosis not present

## 2015-06-21 DIAGNOSIS — I10 Essential (primary) hypertension: Secondary | ICD-10-CM | POA: Diagnosis not present

## 2015-06-21 DIAGNOSIS — E785 Hyperlipidemia, unspecified: Secondary | ICD-10-CM | POA: Diagnosis not present

## 2015-06-22 DIAGNOSIS — M542 Cervicalgia: Secondary | ICD-10-CM | POA: Diagnosis not present

## 2015-06-22 DIAGNOSIS — M25512 Pain in left shoulder: Secondary | ICD-10-CM | POA: Diagnosis not present

## 2015-06-29 DIAGNOSIS — M25512 Pain in left shoulder: Secondary | ICD-10-CM | POA: Diagnosis not present

## 2015-06-29 DIAGNOSIS — M542 Cervicalgia: Secondary | ICD-10-CM | POA: Diagnosis not present

## 2015-07-09 DIAGNOSIS — H35033 Hypertensive retinopathy, bilateral: Secondary | ICD-10-CM | POA: Diagnosis not present

## 2015-07-09 DIAGNOSIS — H524 Presbyopia: Secondary | ICD-10-CM | POA: Diagnosis not present

## 2015-07-09 DIAGNOSIS — H2513 Age-related nuclear cataract, bilateral: Secondary | ICD-10-CM | POA: Diagnosis not present

## 2015-08-24 DIAGNOSIS — E7800 Pure hypercholesterolemia, unspecified: Secondary | ICD-10-CM | POA: Diagnosis not present

## 2015-08-24 DIAGNOSIS — I10 Essential (primary) hypertension: Secondary | ICD-10-CM | POA: Diagnosis not present

## 2015-09-20 ENCOUNTER — Ambulatory Visit (INDEPENDENT_AMBULATORY_CARE_PROVIDER_SITE_OTHER): Payer: Medicare Other | Admitting: Podiatry

## 2015-09-20 ENCOUNTER — Encounter: Payer: Self-pay | Admitting: Podiatry

## 2015-09-20 VITALS — BP 162/91 | HR 67 | Ht 66.0 in | Wt 135.0 lb

## 2015-09-20 DIAGNOSIS — M21969 Unspecified acquired deformity of unspecified lower leg: Secondary | ICD-10-CM | POA: Diagnosis not present

## 2015-09-20 DIAGNOSIS — M2010 Hallux valgus (acquired), unspecified foot: Secondary | ICD-10-CM

## 2015-09-20 DIAGNOSIS — M205X1 Other deformities of toe(s) (acquired), right foot: Secondary | ICD-10-CM | POA: Diagnosis not present

## 2015-09-20 DIAGNOSIS — M21619 Bunion of unspecified foot: Secondary | ICD-10-CM | POA: Diagnosis not present

## 2015-09-20 DIAGNOSIS — M79673 Pain in unspecified foot: Secondary | ICD-10-CM

## 2015-09-20 NOTE — Patient Instructions (Signed)
Seen for painful bunion right foot.  May benefit from custom orthotics. Metatarsal binder dispensed x 1.

## 2015-09-20 NOTE — Progress Notes (Signed)
SUBJECTIVE: 70 y.o. year old female presents complaining of painful feet. Has pain on right foot bunion area.  Also has occasional itch skin on left foot ever since she got a spider bite 9 years ago.  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  REVIEW OF SYSTEMS: A comprehensive review of systems was negative except for: heart burn that was resolved.  OBJECTIVE: DERMATOLOGIC EXAMINATION: Scratch mark over dorsum left foot from bad itch spell.  VASCULAR EXAMINATION OF LOWER LIMBS: Pedal pulses: All pedal pulses are palpable with normal pulsation.  Temperature gradient from tibial crest to dorsum of foot is within normal bilateral.  NEUROLOGIC EXAMINATION OF THE LOWER LIMBS: Abnormal itch spell over first web space left foot following old incident, spider bite. Sharp and Dull discriminatory sensations at the plantar ball of hallux is intact bilateral.   MUSCULOSKELETAL EXAMINATION: Positive for Hallux valgus with bunion deformity right foot. Hypermobile first ray bilateral.   ASSESSMENT: 1. Painful bunion right. 2. Hallux limitus right. 3. Hypermobile first ray R>L. 4. Arthropathy first MPJ right.  PLAN: Reviewed clinical findings and available treatment options. Discussed proper shoe gear.  Metatarsal binder dispensed to right. May benefit from custom orthotics.

## 2015-09-29 ENCOUNTER — Ambulatory Visit: Payer: Medicare Other | Admitting: Podiatry

## 2015-12-21 DIAGNOSIS — Z23 Encounter for immunization: Secondary | ICD-10-CM | POA: Diagnosis not present

## 2015-12-23 DIAGNOSIS — M9901 Segmental and somatic dysfunction of cervical region: Secondary | ICD-10-CM | POA: Diagnosis not present

## 2015-12-23 DIAGNOSIS — M542 Cervicalgia: Secondary | ICD-10-CM | POA: Diagnosis not present

## 2015-12-23 DIAGNOSIS — M25519 Pain in unspecified shoulder: Secondary | ICD-10-CM | POA: Diagnosis not present

## 2015-12-23 DIAGNOSIS — R293 Abnormal posture: Secondary | ICD-10-CM | POA: Diagnosis not present

## 2015-12-23 DIAGNOSIS — M2560 Stiffness of unspecified joint, not elsewhere classified: Secondary | ICD-10-CM | POA: Diagnosis not present

## 2015-12-31 DIAGNOSIS — M9901 Segmental and somatic dysfunction of cervical region: Secondary | ICD-10-CM | POA: Diagnosis not present

## 2015-12-31 DIAGNOSIS — M542 Cervicalgia: Secondary | ICD-10-CM | POA: Diagnosis not present

## 2015-12-31 DIAGNOSIS — R293 Abnormal posture: Secondary | ICD-10-CM | POA: Diagnosis not present

## 2015-12-31 DIAGNOSIS — M25519 Pain in unspecified shoulder: Secondary | ICD-10-CM | POA: Diagnosis not present

## 2015-12-31 DIAGNOSIS — M2560 Stiffness of unspecified joint, not elsewhere classified: Secondary | ICD-10-CM | POA: Diagnosis not present

## 2016-01-03 DIAGNOSIS — M25519 Pain in unspecified shoulder: Secondary | ICD-10-CM | POA: Diagnosis not present

## 2016-01-03 DIAGNOSIS — R293 Abnormal posture: Secondary | ICD-10-CM | POA: Diagnosis not present

## 2016-01-03 DIAGNOSIS — M542 Cervicalgia: Secondary | ICD-10-CM | POA: Diagnosis not present

## 2016-01-03 DIAGNOSIS — M9901 Segmental and somatic dysfunction of cervical region: Secondary | ICD-10-CM | POA: Diagnosis not present

## 2016-01-03 DIAGNOSIS — M2560 Stiffness of unspecified joint, not elsewhere classified: Secondary | ICD-10-CM | POA: Diagnosis not present

## 2016-01-05 DIAGNOSIS — R293 Abnormal posture: Secondary | ICD-10-CM | POA: Diagnosis not present

## 2016-01-05 DIAGNOSIS — M25519 Pain in unspecified shoulder: Secondary | ICD-10-CM | POA: Diagnosis not present

## 2016-01-05 DIAGNOSIS — M542 Cervicalgia: Secondary | ICD-10-CM | POA: Diagnosis not present

## 2016-01-05 DIAGNOSIS — M2560 Stiffness of unspecified joint, not elsewhere classified: Secondary | ICD-10-CM | POA: Diagnosis not present

## 2016-01-05 DIAGNOSIS — M9901 Segmental and somatic dysfunction of cervical region: Secondary | ICD-10-CM | POA: Diagnosis not present

## 2016-01-07 DIAGNOSIS — R293 Abnormal posture: Secondary | ICD-10-CM | POA: Diagnosis not present

## 2016-01-07 DIAGNOSIS — M2560 Stiffness of unspecified joint, not elsewhere classified: Secondary | ICD-10-CM | POA: Diagnosis not present

## 2016-01-07 DIAGNOSIS — M25519 Pain in unspecified shoulder: Secondary | ICD-10-CM | POA: Diagnosis not present

## 2016-01-07 DIAGNOSIS — M542 Cervicalgia: Secondary | ICD-10-CM | POA: Diagnosis not present

## 2016-01-07 DIAGNOSIS — M9901 Segmental and somatic dysfunction of cervical region: Secondary | ICD-10-CM | POA: Diagnosis not present

## 2016-01-10 DIAGNOSIS — R293 Abnormal posture: Secondary | ICD-10-CM | POA: Diagnosis not present

## 2016-01-10 DIAGNOSIS — M9901 Segmental and somatic dysfunction of cervical region: Secondary | ICD-10-CM | POA: Diagnosis not present

## 2016-01-10 DIAGNOSIS — M25519 Pain in unspecified shoulder: Secondary | ICD-10-CM | POA: Diagnosis not present

## 2016-01-10 DIAGNOSIS — M542 Cervicalgia: Secondary | ICD-10-CM | POA: Diagnosis not present

## 2016-01-10 DIAGNOSIS — M2560 Stiffness of unspecified joint, not elsewhere classified: Secondary | ICD-10-CM | POA: Diagnosis not present

## 2016-01-12 DIAGNOSIS — M2560 Stiffness of unspecified joint, not elsewhere classified: Secondary | ICD-10-CM | POA: Diagnosis not present

## 2016-01-12 DIAGNOSIS — M25519 Pain in unspecified shoulder: Secondary | ICD-10-CM | POA: Diagnosis not present

## 2016-01-12 DIAGNOSIS — M9901 Segmental and somatic dysfunction of cervical region: Secondary | ICD-10-CM | POA: Diagnosis not present

## 2016-01-12 DIAGNOSIS — R293 Abnormal posture: Secondary | ICD-10-CM | POA: Diagnosis not present

## 2016-01-12 DIAGNOSIS — M542 Cervicalgia: Secondary | ICD-10-CM | POA: Diagnosis not present

## 2016-01-14 DIAGNOSIS — M25519 Pain in unspecified shoulder: Secondary | ICD-10-CM | POA: Diagnosis not present

## 2016-01-14 DIAGNOSIS — R293 Abnormal posture: Secondary | ICD-10-CM | POA: Diagnosis not present

## 2016-01-14 DIAGNOSIS — M2560 Stiffness of unspecified joint, not elsewhere classified: Secondary | ICD-10-CM | POA: Diagnosis not present

## 2016-01-14 DIAGNOSIS — M9901 Segmental and somatic dysfunction of cervical region: Secondary | ICD-10-CM | POA: Diagnosis not present

## 2016-01-14 DIAGNOSIS — M542 Cervicalgia: Secondary | ICD-10-CM | POA: Diagnosis not present

## 2016-01-17 DIAGNOSIS — M542 Cervicalgia: Secondary | ICD-10-CM | POA: Diagnosis not present

## 2016-01-17 DIAGNOSIS — M2560 Stiffness of unspecified joint, not elsewhere classified: Secondary | ICD-10-CM | POA: Diagnosis not present

## 2016-01-17 DIAGNOSIS — M9901 Segmental and somatic dysfunction of cervical region: Secondary | ICD-10-CM | POA: Diagnosis not present

## 2016-01-17 DIAGNOSIS — R293 Abnormal posture: Secondary | ICD-10-CM | POA: Diagnosis not present

## 2016-01-17 DIAGNOSIS — M25519 Pain in unspecified shoulder: Secondary | ICD-10-CM | POA: Diagnosis not present

## 2016-01-19 DIAGNOSIS — M9901 Segmental and somatic dysfunction of cervical region: Secondary | ICD-10-CM | POA: Diagnosis not present

## 2016-01-19 DIAGNOSIS — M25519 Pain in unspecified shoulder: Secondary | ICD-10-CM | POA: Diagnosis not present

## 2016-01-19 DIAGNOSIS — M542 Cervicalgia: Secondary | ICD-10-CM | POA: Diagnosis not present

## 2016-01-19 DIAGNOSIS — M2560 Stiffness of unspecified joint, not elsewhere classified: Secondary | ICD-10-CM | POA: Diagnosis not present

## 2016-01-19 DIAGNOSIS — R293 Abnormal posture: Secondary | ICD-10-CM | POA: Diagnosis not present

## 2016-04-07 DIAGNOSIS — E785 Hyperlipidemia, unspecified: Secondary | ICD-10-CM | POA: Diagnosis not present

## 2016-04-07 DIAGNOSIS — I10 Essential (primary) hypertension: Secondary | ICD-10-CM | POA: Diagnosis not present

## 2016-04-07 DIAGNOSIS — K219 Gastro-esophageal reflux disease without esophagitis: Secondary | ICD-10-CM | POA: Diagnosis not present

## 2016-06-06 DIAGNOSIS — N183 Chronic kidney disease, stage 3 (moderate): Secondary | ICD-10-CM | POA: Diagnosis not present

## 2016-06-06 DIAGNOSIS — I10 Essential (primary) hypertension: Secondary | ICD-10-CM | POA: Diagnosis not present

## 2016-06-06 DIAGNOSIS — R7301 Impaired fasting glucose: Secondary | ICD-10-CM | POA: Diagnosis not present

## 2016-06-06 DIAGNOSIS — E785 Hyperlipidemia, unspecified: Secondary | ICD-10-CM | POA: Diagnosis not present

## 2016-06-06 DIAGNOSIS — K219 Gastro-esophageal reflux disease without esophagitis: Secondary | ICD-10-CM | POA: Diagnosis not present

## 2016-06-23 DIAGNOSIS — I10 Essential (primary) hypertension: Secondary | ICD-10-CM | POA: Diagnosis not present

## 2016-08-02 DIAGNOSIS — K921 Melena: Secondary | ICD-10-CM | POA: Diagnosis not present

## 2016-09-20 DIAGNOSIS — R102 Pelvic and perineal pain: Secondary | ICD-10-CM | POA: Diagnosis not present

## 2016-09-20 DIAGNOSIS — N958 Other specified menopausal and perimenopausal disorders: Secondary | ICD-10-CM | POA: Diagnosis not present

## 2016-09-20 DIAGNOSIS — Z1231 Encounter for screening mammogram for malignant neoplasm of breast: Secondary | ICD-10-CM | POA: Diagnosis not present

## 2016-09-20 DIAGNOSIS — M8588 Other specified disorders of bone density and structure, other site: Secondary | ICD-10-CM | POA: Diagnosis not present

## 2016-09-20 DIAGNOSIS — Z124 Encounter for screening for malignant neoplasm of cervix: Secondary | ICD-10-CM | POA: Diagnosis not present

## 2016-09-20 DIAGNOSIS — Z6821 Body mass index (BMI) 21.0-21.9, adult: Secondary | ICD-10-CM | POA: Diagnosis not present

## 2016-09-21 DIAGNOSIS — R102 Pelvic and perineal pain: Secondary | ICD-10-CM | POA: Diagnosis not present

## 2016-10-02 DIAGNOSIS — Z0000 Encounter for general adult medical examination without abnormal findings: Secondary | ICD-10-CM | POA: Diagnosis not present

## 2016-10-02 DIAGNOSIS — K219 Gastro-esophageal reflux disease without esophagitis: Secondary | ICD-10-CM | POA: Diagnosis not present

## 2016-10-02 DIAGNOSIS — E785 Hyperlipidemia, unspecified: Secondary | ICD-10-CM | POA: Diagnosis not present

## 2016-10-02 DIAGNOSIS — Z1159 Encounter for screening for other viral diseases: Secondary | ICD-10-CM | POA: Diagnosis not present

## 2016-10-02 DIAGNOSIS — I10 Essential (primary) hypertension: Secondary | ICD-10-CM | POA: Diagnosis not present

## 2016-10-03 DIAGNOSIS — L820 Inflamed seborrheic keratosis: Secondary | ICD-10-CM | POA: Diagnosis not present

## 2016-10-03 DIAGNOSIS — L821 Other seborrheic keratosis: Secondary | ICD-10-CM | POA: Diagnosis not present

## 2016-10-03 DIAGNOSIS — L438 Other lichen planus: Secondary | ICD-10-CM | POA: Diagnosis not present

## 2016-10-03 DIAGNOSIS — L720 Epidermal cyst: Secondary | ICD-10-CM | POA: Diagnosis not present

## 2016-10-03 DIAGNOSIS — L570 Actinic keratosis: Secondary | ICD-10-CM | POA: Diagnosis not present

## 2017-04-09 DIAGNOSIS — H52223 Regular astigmatism, bilateral: Secondary | ICD-10-CM | POA: Diagnosis not present

## 2017-04-09 DIAGNOSIS — H0288B Meibomian gland dysfunction left eye, upper and lower eyelids: Secondary | ICD-10-CM | POA: Diagnosis not present

## 2017-04-09 DIAGNOSIS — H5213 Myopia, bilateral: Secondary | ICD-10-CM | POA: Diagnosis not present

## 2017-04-09 DIAGNOSIS — H524 Presbyopia: Secondary | ICD-10-CM | POA: Diagnosis not present

## 2017-04-09 DIAGNOSIS — H16223 Keratoconjunctivitis sicca, not specified as Sjogren's, bilateral: Secondary | ICD-10-CM | POA: Diagnosis not present

## 2017-04-09 DIAGNOSIS — H0288A Meibomian gland dysfunction right eye, upper and lower eyelids: Secondary | ICD-10-CM | POA: Diagnosis not present

## 2017-04-09 DIAGNOSIS — H04123 Dry eye syndrome of bilateral lacrimal glands: Secondary | ICD-10-CM | POA: Diagnosis not present

## 2017-04-10 DIAGNOSIS — M8580 Other specified disorders of bone density and structure, unspecified site: Secondary | ICD-10-CM | POA: Diagnosis not present

## 2017-04-10 DIAGNOSIS — I10 Essential (primary) hypertension: Secondary | ICD-10-CM | POA: Diagnosis not present

## 2017-04-10 DIAGNOSIS — M5441 Lumbago with sciatica, right side: Secondary | ICD-10-CM | POA: Diagnosis not present

## 2017-04-10 DIAGNOSIS — E785 Hyperlipidemia, unspecified: Secondary | ICD-10-CM | POA: Diagnosis not present

## 2017-04-10 DIAGNOSIS — R1030 Lower abdominal pain, unspecified: Secondary | ICD-10-CM | POA: Diagnosis not present

## 2017-04-10 DIAGNOSIS — N183 Chronic kidney disease, stage 3 (moderate): Secondary | ICD-10-CM | POA: Diagnosis not present

## 2017-04-10 DIAGNOSIS — N950 Postmenopausal bleeding: Secondary | ICD-10-CM | POA: Diagnosis not present

## 2017-04-17 ENCOUNTER — Other Ambulatory Visit: Payer: Self-pay | Admitting: Family Medicine

## 2017-04-17 DIAGNOSIS — N950 Postmenopausal bleeding: Secondary | ICD-10-CM

## 2017-04-20 ENCOUNTER — Ambulatory Visit
Admission: RE | Admit: 2017-04-20 | Discharge: 2017-04-20 | Disposition: A | Discharge status: Home / Self Care | Payer: Medicare Other | Source: Ambulatory Visit | Attending: Family Medicine | Admitting: Family Medicine

## 2017-04-20 DIAGNOSIS — N950 Postmenopausal bleeding: Secondary | ICD-10-CM

## 2017-05-02 DIAGNOSIS — M5441 Lumbago with sciatica, right side: Secondary | ICD-10-CM | POA: Diagnosis not present

## 2017-05-02 DIAGNOSIS — I10 Essential (primary) hypertension: Secondary | ICD-10-CM | POA: Diagnosis not present

## 2017-05-02 DIAGNOSIS — N950 Postmenopausal bleeding: Secondary | ICD-10-CM | POA: Diagnosis not present

## 2017-06-01 DIAGNOSIS — N3001 Acute cystitis with hematuria: Secondary | ICD-10-CM | POA: Diagnosis not present

## 2017-06-01 DIAGNOSIS — I10 Essential (primary) hypertension: Secondary | ICD-10-CM | POA: Diagnosis not present

## 2017-06-04 DIAGNOSIS — H0288B Meibomian gland dysfunction left eye, upper and lower eyelids: Secondary | ICD-10-CM | POA: Diagnosis not present

## 2017-06-04 DIAGNOSIS — H2513 Age-related nuclear cataract, bilateral: Secondary | ICD-10-CM | POA: Diagnosis not present

## 2017-06-04 DIAGNOSIS — H16223 Keratoconjunctivitis sicca, not specified as Sjogren's, bilateral: Secondary | ICD-10-CM | POA: Diagnosis not present

## 2017-06-04 DIAGNOSIS — H0288A Meibomian gland dysfunction right eye, upper and lower eyelids: Secondary | ICD-10-CM | POA: Diagnosis not present

## 2017-06-04 DIAGNOSIS — H04123 Dry eye syndrome of bilateral lacrimal glands: Secondary | ICD-10-CM | POA: Diagnosis not present

## 2017-06-07 DIAGNOSIS — J019 Acute sinusitis, unspecified: Secondary | ICD-10-CM | POA: Diagnosis not present

## 2017-06-07 DIAGNOSIS — R197 Diarrhea, unspecified: Secondary | ICD-10-CM | POA: Diagnosis not present

## 2017-06-07 DIAGNOSIS — B9689 Other specified bacterial agents as the cause of diseases classified elsewhere: Secondary | ICD-10-CM | POA: Diagnosis not present

## 2017-06-07 DIAGNOSIS — I10 Essential (primary) hypertension: Secondary | ICD-10-CM | POA: Diagnosis not present

## 2017-07-16 DIAGNOSIS — H16223 Keratoconjunctivitis sicca, not specified as Sjogren's, bilateral: Secondary | ICD-10-CM | POA: Diagnosis not present

## 2017-07-16 DIAGNOSIS — H04123 Dry eye syndrome of bilateral lacrimal glands: Secondary | ICD-10-CM | POA: Diagnosis not present

## 2017-07-16 DIAGNOSIS — H0288A Meibomian gland dysfunction right eye, upper and lower eyelids: Secondary | ICD-10-CM | POA: Diagnosis not present

## 2017-07-16 DIAGNOSIS — H0288B Meibomian gland dysfunction left eye, upper and lower eyelids: Secondary | ICD-10-CM | POA: Diagnosis not present

## 2017-07-24 ENCOUNTER — Other Ambulatory Visit: Payer: Self-pay | Admitting: Gastroenterology

## 2017-07-24 ENCOUNTER — Ambulatory Visit
Admission: RE | Admit: 2017-07-24 | Discharge: 2017-07-24 | Disposition: A | Discharge status: Home / Self Care | Payer: Medicare Other | Source: Ambulatory Visit | Attending: Gastroenterology | Admitting: Gastroenterology

## 2017-07-24 DIAGNOSIS — R102 Pelvic and perineal pain: Secondary | ICD-10-CM | POA: Diagnosis not present

## 2017-07-24 DIAGNOSIS — K219 Gastro-esophageal reflux disease without esophagitis: Secondary | ICD-10-CM | POA: Diagnosis not present

## 2017-07-24 DIAGNOSIS — R1031 Right lower quadrant pain: Secondary | ICD-10-CM | POA: Diagnosis not present

## 2017-07-24 DIAGNOSIS — Z86010 Personal history of colonic polyps: Secondary | ICD-10-CM | POA: Diagnosis not present

## 2017-07-24 MED ORDER — IOPAMIDOL (ISOVUE-300) INJECTION 61%
100.0000 mL | Freq: Once | INTRAVENOUS | Status: AC | PRN
  Administered 2017-07-24: 100 mL via INTRAVENOUS

## 2017-07-30 DIAGNOSIS — M5441 Lumbago with sciatica, right side: Secondary | ICD-10-CM | POA: Diagnosis not present

## 2017-07-30 DIAGNOSIS — I10 Essential (primary) hypertension: Secondary | ICD-10-CM | POA: Diagnosis not present

## 2017-07-30 DIAGNOSIS — F439 Reaction to severe stress, unspecified: Secondary | ICD-10-CM | POA: Diagnosis not present

## 2017-08-02 ENCOUNTER — Ambulatory Visit
Admission: RE | Admit: 2017-08-02 | Discharge: 2017-08-02 | Disposition: A | Discharge status: Home / Self Care | Payer: Medicare Other | Source: Ambulatory Visit | Attending: Chiropractic Medicine | Admitting: Chiropractic Medicine

## 2017-08-02 ENCOUNTER — Other Ambulatory Visit: Payer: Self-pay | Admitting: Chiropractic Medicine

## 2017-08-02 DIAGNOSIS — M5126 Other intervertebral disc displacement, lumbar region: Secondary | ICD-10-CM | POA: Diagnosis not present

## 2017-08-02 DIAGNOSIS — M5416 Radiculopathy, lumbar region: Secondary | ICD-10-CM

## 2017-08-02 DIAGNOSIS — M47816 Spondylosis without myelopathy or radiculopathy, lumbar region: Secondary | ICD-10-CM | POA: Diagnosis not present

## 2017-08-02 DIAGNOSIS — M5440 Lumbago with sciatica, unspecified side: Secondary | ICD-10-CM

## 2017-11-01 DIAGNOSIS — H16223 Keratoconjunctivitis sicca, not specified as Sjogren's, bilateral: Secondary | ICD-10-CM | POA: Diagnosis not present

## 2017-11-01 DIAGNOSIS — H0288B Meibomian gland dysfunction left eye, upper and lower eyelids: Secondary | ICD-10-CM | POA: Diagnosis not present

## 2017-11-01 DIAGNOSIS — H0288A Meibomian gland dysfunction right eye, upper and lower eyelids: Secondary | ICD-10-CM | POA: Diagnosis not present

## 2017-12-07 DIAGNOSIS — Z760 Encounter for issue of repeat prescription: Secondary | ICD-10-CM | POA: Diagnosis not present

## 2017-12-07 DIAGNOSIS — I10 Essential (primary) hypertension: Secondary | ICD-10-CM | POA: Diagnosis not present

## 2018-01-01 DIAGNOSIS — I10 Essential (primary) hypertension: Secondary | ICD-10-CM | POA: Diagnosis not present

## 2018-01-17 DIAGNOSIS — E782 Mixed hyperlipidemia: Secondary | ICD-10-CM | POA: Diagnosis not present

## 2018-01-17 DIAGNOSIS — Z0000 Encounter for general adult medical examination without abnormal findings: Secondary | ICD-10-CM | POA: Diagnosis not present

## 2018-01-17 DIAGNOSIS — I10 Essential (primary) hypertension: Secondary | ICD-10-CM | POA: Diagnosis not present

## 2018-01-17 DIAGNOSIS — K219 Gastro-esophageal reflux disease without esophagitis: Secondary | ICD-10-CM | POA: Diagnosis not present

## 2018-01-22 DIAGNOSIS — K219 Gastro-esophageal reflux disease without esophagitis: Secondary | ICD-10-CM | POA: Diagnosis not present

## 2018-01-22 DIAGNOSIS — I10 Essential (primary) hypertension: Secondary | ICD-10-CM | POA: Diagnosis not present

## 2018-01-22 DIAGNOSIS — Z0000 Encounter for general adult medical examination without abnormal findings: Secondary | ICD-10-CM | POA: Diagnosis not present

## 2018-01-22 DIAGNOSIS — E782 Mixed hyperlipidemia: Secondary | ICD-10-CM | POA: Diagnosis not present

## 2018-02-24 DIAGNOSIS — J069 Acute upper respiratory infection, unspecified: Secondary | ICD-10-CM | POA: Diagnosis not present

## 2018-03-02 DIAGNOSIS — J40 Bronchitis, not specified as acute or chronic: Secondary | ICD-10-CM | POA: Diagnosis not present

## 2018-03-02 DIAGNOSIS — J0100 Acute maxillary sinusitis, unspecified: Secondary | ICD-10-CM | POA: Diagnosis not present

## 2018-04-09 DIAGNOSIS — K219 Gastro-esophageal reflux disease without esophagitis: Secondary | ICD-10-CM | POA: Diagnosis not present

## 2018-04-09 DIAGNOSIS — I10 Essential (primary) hypertension: Secondary | ICD-10-CM | POA: Diagnosis not present

## 2018-04-09 DIAGNOSIS — M79671 Pain in right foot: Secondary | ICD-10-CM | POA: Diagnosis not present

## 2018-04-15 DIAGNOSIS — H0288A Meibomian gland dysfunction right eye, upper and lower eyelids: Secondary | ICD-10-CM | POA: Diagnosis not present

## 2018-04-15 DIAGNOSIS — H5213 Myopia, bilateral: Secondary | ICD-10-CM | POA: Diagnosis not present

## 2018-04-15 DIAGNOSIS — H52223 Regular astigmatism, bilateral: Secondary | ICD-10-CM | POA: Diagnosis not present

## 2018-04-15 DIAGNOSIS — H16223 Keratoconjunctivitis sicca, not specified as Sjogren's, bilateral: Secondary | ICD-10-CM | POA: Diagnosis not present

## 2018-04-15 DIAGNOSIS — H02201 Unspecified lagophthalmos right upper eyelid: Secondary | ICD-10-CM | POA: Diagnosis not present

## 2018-04-15 DIAGNOSIS — H524 Presbyopia: Secondary | ICD-10-CM | POA: Diagnosis not present

## 2018-04-15 DIAGNOSIS — H0288B Meibomian gland dysfunction left eye, upper and lower eyelids: Secondary | ICD-10-CM | POA: Diagnosis not present

## 2018-04-19 DIAGNOSIS — E785 Hyperlipidemia, unspecified: Secondary | ICD-10-CM | POA: Diagnosis not present

## 2018-04-19 DIAGNOSIS — R51 Headache: Secondary | ICD-10-CM | POA: Diagnosis not present

## 2018-04-22 DIAGNOSIS — M79671 Pain in right foot: Secondary | ICD-10-CM | POA: Diagnosis not present

## 2018-04-22 DIAGNOSIS — M19071 Primary osteoarthritis, right ankle and foot: Secondary | ICD-10-CM | POA: Diagnosis not present

## 2018-04-29 DIAGNOSIS — M7672 Peroneal tendinitis, left leg: Secondary | ICD-10-CM | POA: Diagnosis not present

## 2018-04-29 DIAGNOSIS — M79673 Pain in unspecified foot: Secondary | ICD-10-CM | POA: Diagnosis not present

## 2018-04-29 DIAGNOSIS — G8929 Other chronic pain: Secondary | ICD-10-CM | POA: Diagnosis not present

## 2018-05-02 DIAGNOSIS — E785 Hyperlipidemia, unspecified: Secondary | ICD-10-CM | POA: Diagnosis not present

## 2018-05-02 DIAGNOSIS — I10 Essential (primary) hypertension: Secondary | ICD-10-CM | POA: Diagnosis not present

## 2018-05-02 DIAGNOSIS — R600 Localized edema: Secondary | ICD-10-CM | POA: Diagnosis not present

## 2018-06-17 DIAGNOSIS — J309 Allergic rhinitis, unspecified: Secondary | ICD-10-CM | POA: Diagnosis not present

## 2018-06-17 DIAGNOSIS — I10 Essential (primary) hypertension: Secondary | ICD-10-CM | POA: Diagnosis not present

## 2018-07-02 DIAGNOSIS — K219 Gastro-esophageal reflux disease without esophagitis: Secondary | ICD-10-CM | POA: Diagnosis not present

## 2018-07-02 DIAGNOSIS — I10 Essential (primary) hypertension: Secondary | ICD-10-CM | POA: Diagnosis not present

## 2018-07-12 DIAGNOSIS — L239 Allergic contact dermatitis, unspecified cause: Secondary | ICD-10-CM | POA: Diagnosis not present

## 2018-07-18 DIAGNOSIS — I10 Essential (primary) hypertension: Secondary | ICD-10-CM | POA: Diagnosis not present

## 2018-09-19 DIAGNOSIS — Z5181 Encounter for therapeutic drug level monitoring: Secondary | ICD-10-CM | POA: Diagnosis not present

## 2018-09-19 DIAGNOSIS — Z13220 Encounter for screening for lipoid disorders: Secondary | ICD-10-CM | POA: Diagnosis not present

## 2018-09-19 DIAGNOSIS — Z136 Encounter for screening for cardiovascular disorders: Secondary | ICD-10-CM | POA: Diagnosis not present

## 2018-09-19 DIAGNOSIS — Z79899 Other long term (current) drug therapy: Secondary | ICD-10-CM | POA: Diagnosis not present

## 2018-09-19 DIAGNOSIS — E782 Mixed hyperlipidemia: Secondary | ICD-10-CM | POA: Diagnosis not present

## 2018-11-05 DIAGNOSIS — E875 Hyperkalemia: Secondary | ICD-10-CM | POA: Diagnosis not present

## 2018-12-21 DIAGNOSIS — Z23 Encounter for immunization: Secondary | ICD-10-CM | POA: Diagnosis not present

## 2018-12-25 DIAGNOSIS — H02201 Unspecified lagophthalmos right upper eyelid: Secondary | ICD-10-CM | POA: Diagnosis not present

## 2018-12-25 DIAGNOSIS — H0288A Meibomian gland dysfunction right eye, upper and lower eyelids: Secondary | ICD-10-CM | POA: Diagnosis not present

## 2018-12-25 DIAGNOSIS — H16223 Keratoconjunctivitis sicca, not specified as Sjogren's, bilateral: Secondary | ICD-10-CM | POA: Diagnosis not present

## 2018-12-25 DIAGNOSIS — H0288B Meibomian gland dysfunction left eye, upper and lower eyelids: Secondary | ICD-10-CM | POA: Diagnosis not present

## 2019-01-21 DIAGNOSIS — M8949 Other hypertrophic osteoarthropathy, multiple sites: Secondary | ICD-10-CM | POA: Diagnosis not present

## 2019-01-21 DIAGNOSIS — Z79818 Long term (current) use of other agents affecting estrogen receptors and estrogen levels: Secondary | ICD-10-CM | POA: Diagnosis not present

## 2019-01-21 DIAGNOSIS — K219 Gastro-esophageal reflux disease without esophagitis: Secondary | ICD-10-CM | POA: Diagnosis not present

## 2019-01-21 DIAGNOSIS — Z0000 Encounter for general adult medical examination without abnormal findings: Secondary | ICD-10-CM | POA: Diagnosis not present

## 2019-01-21 DIAGNOSIS — E782 Mixed hyperlipidemia: Secondary | ICD-10-CM | POA: Diagnosis not present

## 2019-01-21 DIAGNOSIS — Z23 Encounter for immunization: Secondary | ICD-10-CM | POA: Diagnosis not present

## 2019-01-21 DIAGNOSIS — M24812 Other specific joint derangements of left shoulder, not elsewhere classified: Secondary | ICD-10-CM | POA: Diagnosis not present

## 2019-01-21 DIAGNOSIS — M2550 Pain in unspecified joint: Secondary | ICD-10-CM | POA: Diagnosis not present

## 2019-01-21 DIAGNOSIS — I6529 Occlusion and stenosis of unspecified carotid artery: Secondary | ICD-10-CM | POA: Diagnosis not present

## 2019-01-21 DIAGNOSIS — I10 Essential (primary) hypertension: Secondary | ICD-10-CM | POA: Diagnosis not present

## 2019-01-29 DIAGNOSIS — H0288B Meibomian gland dysfunction left eye, upper and lower eyelids: Secondary | ICD-10-CM | POA: Diagnosis not present

## 2019-01-29 DIAGNOSIS — H0288A Meibomian gland dysfunction right eye, upper and lower eyelids: Secondary | ICD-10-CM | POA: Diagnosis not present

## 2019-01-29 DIAGNOSIS — H02201 Unspecified lagophthalmos right upper eyelid: Secondary | ICD-10-CM | POA: Diagnosis not present

## 2019-01-29 DIAGNOSIS — H16223 Keratoconjunctivitis sicca, not specified as Sjogren's, bilateral: Secondary | ICD-10-CM | POA: Diagnosis not present

## 2020-06-21 ENCOUNTER — Other Ambulatory Visit: Payer: Self-pay | Admitting: Gastroenterology

## 2020-06-21 DIAGNOSIS — R1031 Right lower quadrant pain: Secondary | ICD-10-CM

## 2020-07-06 ENCOUNTER — Ambulatory Visit
Admission: RE | Admit: 2020-07-06 | Discharge: 2020-07-06 | Disposition: A | Discharge status: Home / Self Care | Payer: Medicare Other | Source: Ambulatory Visit | Attending: Gastroenterology | Admitting: Gastroenterology

## 2020-07-06 DIAGNOSIS — R1031 Right lower quadrant pain: Secondary | ICD-10-CM

## 2020-07-06 MED ORDER — IOPAMIDOL (ISOVUE-300) INJECTION 61%
100.0000 mL | Freq: Once | INTRAVENOUS | Status: AC | PRN
  Administered 2020-07-06: 100 mL via INTRAVENOUS

## 2022-06-11 ENCOUNTER — Other Ambulatory Visit: Payer: Self-pay

## 2022-06-11 ENCOUNTER — Emergency Department (HOSPITAL_BASED_OUTPATIENT_CLINIC_OR_DEPARTMENT_OTHER)
Admission: EM | Admit: 2022-06-11 | Discharge: 2022-06-11 | Disposition: A | Discharge status: Home / Self Care | Payer: PPO | Attending: Emergency Medicine | Admitting: Emergency Medicine

## 2022-06-11 ENCOUNTER — Encounter (HOSPITAL_BASED_OUTPATIENT_CLINIC_OR_DEPARTMENT_OTHER): Payer: Self-pay

## 2022-06-11 DIAGNOSIS — N10 Acute tubulo-interstitial nephritis: Secondary | ICD-10-CM

## 2022-06-11 DIAGNOSIS — I10 Essential (primary) hypertension: Secondary | ICD-10-CM | POA: Insufficient documentation

## 2022-06-11 DIAGNOSIS — R300 Dysuria: Secondary | ICD-10-CM | POA: Insufficient documentation

## 2022-06-11 DIAGNOSIS — N3001 Acute cystitis with hematuria: Secondary | ICD-10-CM | POA: Diagnosis not present

## 2022-06-11 HISTORY — DX: Urinary tract infection, site not specified: N39.0

## 2022-06-11 LAB — URINALYSIS, ROUTINE W REFLEX MICROSCOPIC
Bilirubin Urine: NEGATIVE
Glucose, UA: NEGATIVE mg/dL
Ketones, ur: NEGATIVE mg/dL
Nitrite: NEGATIVE
Protein, ur: NEGATIVE mg/dL
Specific Gravity, Urine: >=1.03 (ref 1.005–1.030)
pH: 6 (ref 5.0–8.0)

## 2022-06-11 LAB — URINALYSIS, MICROSCOPIC (REFLEX)

## 2022-06-11 MED ORDER — CEFPODOXIME PROXETIL 200 MG PO TABS: 200.0000 mg | ORAL_TABLET | Freq: Two times a day (BID) | ORAL | 0 refills | Status: AC

## 2022-06-11 NOTE — ED Provider Notes (Signed)
[facility] Provider Note   CSN: [id number] Arrival date & time: 06/11/22  1425     History  Chief Complaint  Patient presents with   Dysuria    Whitney Potter is a 77 y.o. female.  The history is provided by the patient and medical records. No language interpreter was used.  Dysuria Pain quality:  Burning Pain severity:  Moderate Onset quality:  Gradual Duration:  3 days Timing:  Constant Progression:  Waxing and waning Chronicity:  Recurrent Relieved by:  Nothing Worsened by:  Nothing Ineffective treatments:  Phenazopyridine Urinary symptoms: frequent urination   Urinary symptoms: no hematuria   Associated symptoms: abdominal pain and flank pain   Associated symptoms: no fever, no nausea, no vaginal discharge and no vomiting        Home Medications Prior to Admission medications   Medication Sig Start Date End Date Taking? Authorizing Provider  Ascorbic Acid (VITAMIN C) 500 MG CAPS Take 2 tablets by mouth daily.    [provider]  aspirin 81 MG tablet Take 81 mg by mouth every other day.     [provider]  B Complex-C (B-COMPLEX WITH VITAMIN C) tablet Take 1 tablet by mouth daily.    [provider]  bisoprolol (ZEBETA) 5 MG tablet Take 5 mg by mouth daily.    [provider]  Calcium Carb-Cholecalciferol (CALCIUM 1000 + D) 1000-800 MG-UNIT TABS Take 1 tablet by mouth every other day.    [provider]  co-enzyme Q-10 50 MG capsule Take 50 mg by mouth daily.    [provider]  fenofibrate (TRICOR) 48 MG tablet 1 by mouth daily 03/22/11   [provider]  fish oil-omega-3 fatty acids 1000 MG capsule Take 2 g by mouth daily.    [provider]  Flaxseed, Linseed, 1300 MG CAPS Take 1 capsule by mouth daily.    [provider]  fluticasone (FLONASE) 50 MCG/ACT nasal spray Place 2 sprays into the nose 2 (two) times daily. 2 sprays in each  nostril daily 04/18/11   [provider]  Glucosamine-Chondroitin-Vit D3 1500-1200-800 MG-MG-UNIT PACK Take 1 tablet by mouth every other day.    [provider]  HORSE CHESTNUT PO Take 1 tablet by mouth daily.    [provider]  Lutein-Zeaxanthin 25-5 MG CAPS Take 1 capsule by mouth daily.    [provider]  MAGNESIUM CL-CALCIUM CARBONATE PO Take 1 tablet by mouth daily.    [provider]  Nutritional Supplements (JUICE PLUS FIBRE PO) Take 2 capsules by mouth daily.    [provider]  omeprazole (PRILOSEC) 20 MG capsule Take 40 mg by mouth 2 (two) times daily.     [provider]  Probiotic Product (PROBIOTIC FORMULA PO) Take 1 tablet by mouth daily.    [provider]  Red Yeast Rice Extract 600 MG CAPS Take 1,200 mg by mouth 2 (two) times daily.    [provider]  vitamin E 400 UNIT capsule Take 400 Units by mouth daily.    [provider]      Allergies    Codeine, Darvon, and Demerol    Review of Systems   Review of Systems  Constitutional:  Negative for chills, diaphoresis, fatigue and fever.  HENT:  Negative for congestion.   Respiratory:  Negative for cough, chest tightness, shortness of breath and wheezing.   Cardiovascular:  Negative for chest pain and palpitations.  Gastrointestinal:  Positive for abdominal pain. Negative for constipation, diarrhea, nausea and vomiting.  Genitourinary:  Positive for dysuria, flank pain and frequency. Negative for decreased urine volume, hematuria, pelvic pain, vaginal bleeding, vaginal discharge and vaginal pain.  Musculoskeletal:  Positive for back pain (mild soreness). Negative for neck pain.  Skin:  Negative for rash and wound.  Neurological:  Negative for headaches.  Psychiatric/Behavioral:  Negative for agitation.   All other systems reviewed and are negative.   Physical Exam Updated Vital Signs BP (!) 153/79 (BP Location: Right Arm)   Pulse  79   Temp 97.8 F (36.6 C) (Oral)   Resp 20   SpO2 97%  Physical Exam Vitals and nursing note reviewed.  Constitutional:      General: She is not in acute distress.    Appearance: She is well-developed. She is not ill-appearing, toxic-appearing or diaphoretic.  HENT:     Head: Normocephalic and atraumatic.     Right Ear: External ear normal.     Left Ear: External ear normal.     Nose: Nose normal.     Mouth/Throat:     Mouth: Mucous membranes are moist.     Pharynx: No oropharyngeal exudate or posterior oropharyngeal erythema.  Eyes:     Conjunctiva/sclera: Conjunctivae normal.     Pupils: Pupils are equal, round, and reactive to light.  Cardiovascular:     Rate and Rhythm: Normal rate.     Pulses: Normal pulses.  Pulmonary:     Effort: No respiratory distress.     Breath sounds: No stridor. No wheezing, rhonchi or rales.  Chest:     Chest wall: No tenderness.  Abdominal:     General: There is no distension.     Tenderness: There is no abdominal tenderness. There is right CVA tenderness (mild) and left CVA tenderness (mild). There is no guarding or rebound.  Musculoskeletal:        General: Tenderness present.     Cervical back: Normal range of motion and neck supple. No tenderness.     Right lower leg: No edema.     Left lower leg: No edema.  Skin:    General: Skin is warm.     Findings: No erythema or rash.  Neurological:     Mental Status: She is alert and oriented to person, place, and time.     Motor: No abnormal muscle tone.     Coordination: Coordination normal.     Deep Tendon Reflexes: Reflexes are normal and symmetric.     ED Results / Procedures / Treatments   Labs (all labs ordered are listed, but only abnormal results are displayed) Labs Reviewed  URINALYSIS, ROUTINE W REFLEX MICROSCOPIC - Abnormal; Notable for the following components:      Result Value   Color, Urine AMBER (*)    APPearance HAZY (*)    Hgb urine dipstick SMALL (*)     Leukocytes,Ua TRACE (*)    All other components within normal limits  URINALYSIS, MICROSCOPIC (REFLEX) - Abnormal; Notable for the following components:   Bacteria, UA RARE (*)    All other components within normal limits  URINE CULTURE    EKG None  Radiology No results found.  Procedures Procedures    Medications Ordered in ED Medications - No data to display  ED Course/ Medical Decision Making/ A&P  Medical Decision Making   Whitney Potter is a 77 y.o. female with a past medical history significant for hypertension, hyperlipidemia, and previous urinary tract infections who presents with dysuria, frequency, bladder pressure, and some mild discomfort going towards her flanks.  She reports it feels similar to UTIs she has had in the past.  She reports the pain is going on for the last 3 days and did not respond to Azo.  She reports that several months ago she had similar UTI and was on antibiotics that improved but she reports she did not take them completely and the full doses.  She reports no nausea or vomiting.  Denies fevers or chills.  She reports the pain is intermittent and mild to moderate but denies any history of kidney stones.  She denies any constipation, diarrhea, or other acute changes.  She is focused on take care of her husband who has been very sick recently.  On exam, lungs clear, chest nontender.  Abdomen nontender.  Flanks were not focally tender but she reported mild soreness.  No rashes seen.  Mucous membranes were moist.  Patient well-appearing with out fevers, tachycardia, hypoxia, or tachypnea.  Patient otherwise well-appearing on exam initially.  We had a shared decision-making conversation after urinalysis does show evidence of leukocytes and bacteria.  Clinically I suspect given her symptoms and history she has a recurrent UTI or even early pyelonephritis with the flank discomfort.  We discussed getting imaging or further labs  but she did not want this.  She would rather get treated for the UTI we have discovered.  She is amenable to a sending off a culture in case there is some resistant bacteria given the incomplete antibiotic use previously.  Patient will be called if something was resistant.  She is not allergic antibiotics and agreed to this plan.  Patient understands return precautions with symptoms or change or worsen over the next 2 days as she may need further workup with labs or even IV antibiotics and admission.  Patient discharged with prescription for cefpodoxime for 2 weeks and follow-up on the culture and follow-up with her PCP.  Patient agrees with plan and understand return precautions.  She no questions or concerns and was discharged in good condition.             Final Clinical Impression(s) / ED Diagnoses Final diagnoses:  Dysuria  Acute pyelonephritis  Acute cystitis with hematuria    Rx / DC Orders ED Discharge Orders          Ordered    cefpodoxime (VANTIN) 200 MG tablet  2 times daily        06/11/22 1559            Clinical Impression: 1. Dysuria   2. Acute pyelonephritis   3. Acute cystitis with hematuria     Disposition: Discharge  Condition: Good  I have discussed the results, Dx and Tx plan with the pt(& family if present). He/she/they expressed understanding and agree(s) with the plan. Discharge instructions discussed at great length. Strict return precautions discussed and pt &/or family have verbalized understanding of the instructions. No further questions at time of discharge.    Discharge Medication List as of 06/11/2022  4:00 PM     START taking these medications   Details  cefpodoxime (VANTIN) 200 MG tablet Take 1 tablet (200 mg total) by mouth 2 (two) times daily for 14 days., Starting Sun 06/11/2022, Until Sun 06/25/2022, Print  Follow Up: Earnie Larsson, PA-C [id number] [place] [id number] [facility] [facility] [id number] [place] [phone]       Rya Rausch, Gwenyth Allegra, MD 06/11/22 [phone]

## 2022-06-11 NOTE — ED Triage Notes (Addendum)
Pt reports urinary burning, frequency and pressure. Symptoms started Friday . Pt took AZO without relief

## 2022-06-11 NOTE — Discharge Instructions (Signed)
Your history, exam, evaluation are consistent with urinary tract infection and given the discomfort going towards her flanks I suspect you may have very early pyelonephritis or kidney infection.  We had a shared decision-making conversation and agreed to hold on more extensive labs and imaging today as you promised to return if symptoms were to change or worsen but we will treat you for the urine/kidney infection.  Please take the antibiotics fully for the next 2 weeks and rest and stay hydrated.  Please follow-up with your primary doctor.  If any symptoms were to change or worsen acutely, please return to the nearest emergency department.

## 2022-06-12 LAB — URINE CULTURE: Culture: <10000 — AB

## 2023-01-08 ENCOUNTER — Ambulatory Visit: Payer: PPO | Attending: Cardiology | Admitting: Cardiology

## 2023-01-08 ENCOUNTER — Encounter: Payer: Self-pay | Admitting: Cardiology

## 2023-01-08 VITALS — HR 70 | Ht 66.0 in | Wt 149.6 lb

## 2023-01-08 DIAGNOSIS — I10 Essential (primary) hypertension: Secondary | ICD-10-CM | POA: Diagnosis not present

## 2023-01-08 DIAGNOSIS — R079 Chest pain, unspecified: Secondary | ICD-10-CM

## 2023-01-08 DIAGNOSIS — R072 Precordial pain: Secondary | ICD-10-CM | POA: Diagnosis not present

## 2023-01-08 DIAGNOSIS — E785 Hyperlipidemia, unspecified: Secondary | ICD-10-CM | POA: Diagnosis not present

## 2023-01-08 MED ORDER — METOPROLOL TARTRATE 100 MG PO TABS: ORAL_TABLET | ORAL | 0 refills | Status: DC

## 2023-01-08 NOTE — Patient Instructions (Addendum)
Medication Instructions:  Continue all medications *If you need a refill on your cardiac medications before your next appointment, please call your pharmacy*   Lab Work: None ordered   Testing/Procedures:  Coronary CT will be scheduled after approved by insurance    Follow instructions below   Schedule Echo first available      Follow-Up: At [facility], you and your health needs are our priority.  As part of our continuing mission to provide you with exceptional heart care, we have created designated Provider Care Teams.  These Care Teams include your primary Cardiologist (physician) and Advanced Practice Providers (APPs -  Physician Assistants and Nurse Practitioners) who all work together to provide you with the care you need, when you need it.  We recommend signing up for the patient portal called "MyChart".  Sign up information is provided on this After Visit Summary.  MyChart is used to connect with patients for Virtual Visits (Telemedicine).  Patients are able to view lab/test results, encounter notes, upcoming appointments, etc.  Non-urgent messages can be sent to your provider as well.   To learn more about what you can do with MyChart, go to [email or web address].    Your next appointment:  3 months    Provider:  Dr.Schumann        Your cardiac CT will be scheduled at one of the below locations:   [facility] [address] [place], [place] [address] [phone]  OR  [facility] [address] [place], [place] [address] [phone]  OR   [facility] [address] [place], [place] [address] [phone]  If scheduled at [facility], please arrive at the [facility] and Children's Entrance (Entrance C2) of [facility] 30 minutes prior to test start time. You can use the FREE valet parking offered at entrance C (encouraged to control the heart  rate for the test)  Proceed to the [facility] Radiology Department (first floor) to check-in and test prep.  All radiology patients and guests should use entrance C2 at [facility], accessed from [facility], even though the hospital's physical address listed is [address]    If scheduled at [facility] or [facility], please arrive 15 mins early for check-in and test prep.  There is spacious parking and easy access to the radiology department from the [facility] entrance. Please enter here and check-in with the desk attendant.   Please follow these instructions carefully (unless otherwise directed):  An IV will be required for this test and Nitroglycerin will be given.    On the Night Before the Test: Be sure to Drink plenty of water. Do not consume any caffeinated/decaffeinated beverages or chocolate 12 hours prior to your test. Do not take any antihistamines 12 hours prior to your test.   On the Day of the Test: Drink plenty of water until 1 hour prior to the test. Do not eat any food 1 hour prior to test. You may take your regular medications prior to the test.  Hold Toprol XL morning of test Take metoprolol 100 mg two hours prior to test. If you take Furosemide/Hydrochlorothiazide/Spironolactone, please HOLD on the morning of the test. FEMALES- please wear underwire-free bra if available, avoid dresses & tight clothing       After the Test: Drink plenty of water. After receiving IV contrast, you may experience a mild  flushed feeling. This is normal. On occasion, you may experience a mild rash up to 24 hours after the test. This is not dangerous. If this occurs, you can take Benadryl 25 mg and increase your fluid intake. If you experience trouble breathing, this can be serious. If it is severe call 911 IMMEDIATELY. If it is mild, please call our office. If you take any of these  medications: Glipizide/Metformin, Avandament, Glucavance, please do not take 48 hours after completing test unless otherwise instructed.  We will call to schedule your test 2-4 weeks out understanding that some insurance companies will need an authorization prior to the service being performed.   For more information and frequently asked questions, please visit our website : [email or web address]  For non-scheduling related questions, please contact the cardiac imaging nurse navigator should you have any questions/concerns: Cardiac Imaging Nurse Navigators Direct Office Dial: [phone]   For scheduling needs, including cancellations and rescheduling, please call [place], [phone].

## 2023-01-08 NOTE — Progress Notes (Signed)
Cardiology Office Note:    Date:  01/08/2023   ID:  Whitney Potter, DOB 14-Jul-1945, MRN [id number]  PCP:  Lavonda Jumbo, PA-C  Cardiologist:  None  Electrophysiologist:  None   Referring MD: Lavonda Jumbo, PA-C   Chief Complaint  Patient presents with   Chest Pain    History of Present Illness:    Whitney Potter is a 77 y.o. female with a hx of hypertension, hyperlipidemia who is referred by Karna Dupes, PA for evaluation of chest pain.  She was seen in the ED at [facility] for chest pain on 01/05/2023.  Workup unremarkable.  She previously followed with Dr. [place], last seen in 2016.  She had normal Myoview in 2004.  Echocardiogram in 2009 was unremarkable.  She reports he started having chest pain at end of August/early September 2024.  States it feels like pressure in center of her chest but can radiate up her neck.  States that seems related to eating but has also noted it with exertion.  Can also occur at rest.  Can last for hours.  She presented to ED at [facility] for symptoms on 01/05/2023.  Also reports feeling short of breath during episodes.  Reports occasional lightheadedness but denies any syncope.  No lower extremity edema.  No smoking history.  Family history includes mother had MI at age 63, also underwent carotid endarterectomy and had a pacemaker.  Father had CABG at age 67.   Past Medical History:  Diagnosis Date   H/O tracheostomy    Hyperlipidemia    Hypertension    PAC (premature atrial contraction)    UTI (urinary tract infection)     Past Surgical History:  Procedure Laterality Date   APPENDECTOMY     BREAST LUMPECTOMY     DILATION AND CURETTAGE OF UTERUS     TRACHEOSTOMY      Current Medications: Current Meds  Medication Sig   amLODipine (NORVASC) 5 MG tablet Take 1 tablet (5 mg total) by mouth daily.   B Complex-C (B-COMPLEX WITH VITAMIN C) tablet Take 1 tablet by mouth daily.   Bempedoic Acid-Ezetimibe (NEXLIZET) 180-10 MG TABS Take by  mouth daily.   Cholecalciferol (VITAMIN D-3) 25 MCG (1000 UT) CAPS Take 2 capsules twice a day   Digestive Enzymes (ENZYMATIC DIGESTANT) TBEC Take 1 tablet daily   diphenhydrAMINE (BENADRYL ALLERGY) 25 mg capsule Take 1 capsule (25 mg total) by mouth every 6 (six) hours as needed.   Echinacea 400 MG CAPS Take 400 mg daily   fenofibrate (TRICOR) 48 MG tablet Take 2 tablets daily   fluticasone (FLONASE) 50 MCG/ACT nasal spray Place 2 sprays into the nose 2 (two) times daily. 2 sprays in each nostril daily   Glucosamine-Chondroitin-Vit D3 1500-1200-800 MG-MG-UNIT PACK Take 1 tablet by mouth every other day.   guaifenesin (HUMIBID E) 400 MG TABS tablet Take 400 mg by mouth every 4 (four) hours.   loratadine (CLARITIN) 10 MG tablet Take 1 tablet (10 mg total) by mouth daily as needed for allergies.   Lutein 20 MG CAPS Take 20 mg daily   Melatonin 10 MG TABS Take 1/2 tablet ( 5 mg ) after dinner   metoprolol succinate (TOPROL XL) 25 MG 24 hr tablet Take 1 tablet (25 mg) in am and 1/2 tablet (12.5) mg in pm   Misc Natural Products (DEEP SLEEP) CAPS Take 1 capsule at bedtime   Multiple Vitamins-Minerals (ALIVE ONCE DAILY WOMENS) TABS Take 1 tablet daily   omeprazole (  PRILOSEC) 20 MG capsule Take 40 mg by mouth 2 (two) times daily.    oxyCODONE (ROXICODONE) 5 MG immediate release tablet Take 1 tablet (5 mg total) by mouth every 4 (four) hours as needed for severe pain (pain score 7-10).   Probiotic Product (PROBIOTIC FORMULA PO) Take 1 tablet by mouth daily.   Specialty Vitamins Products (COLLAGEN ULTRA) CAPS Take 6000 mg daily   tamsulosin (FLOMAX) 0.4 MG CAPS capsule Take 0.4 mg daily   valsartan (DIOVAN) 320 MG tablet Take 320 mg by mouth daily.   Vitamin E 670 MG (1000 UT) CAPS Take one capsule daily   [DISCONTINUED] Ascorbic Acid (VITAMIN C) 500 MG CAPS Take 2 tablets by mouth daily.   [DISCONTINUED] aspirin 81 MG tablet Take 81 mg by mouth every other day.    [DISCONTINUED] bisoprolol (ZEBETA) 5  MG tablet Take 5 mg by mouth daily.   [DISCONTINUED] Calcium Carb-Cholecalciferol (CALCIUM 1000 + D) 1000-800 MG-UNIT TABS Take 1 tablet by mouth every other day.   [DISCONTINUED] ciprofloxacin (CIPRO) 500 MG tablet Take 500 mg by mouth 2 (two) times daily.   [DISCONTINUED] co-enzyme Q-10 50 MG capsule Take 50 mg by mouth daily.   [DISCONTINUED] fenofibrate (TRICOR) 48 MG tablet 1 by mouth daily   [DISCONTINUED] fish oil-omega-3 fatty acids 1000 MG capsule Take 2 g by mouth daily.   [DISCONTINUED] Flaxseed, Linseed, 1300 MG CAPS Take 1 capsule by mouth daily.   [DISCONTINUED] fluticasone (FLONASE) 50 MCG/ACT nasal spray Place 2 sprays into the nose 2 (two) times daily.   [DISCONTINUED] fluticasone (FLONASE) 50 MCG/ACT nasal spray Place 1 spray into both nostrils daily.   [DISCONTINUED] HORSE CHESTNUT PO Take 1 tablet by mouth daily.   [DISCONTINUED] Lutein-Zeaxanthin 25-5 MG CAPS Take 1 capsule by mouth daily.   [DISCONTINUED] MAGNESIUM CL-CALCIUM CARBONATE PO Take 1 tablet by mouth daily.   [DISCONTINUED] metoprolol succinate (TOPROL-XL) 25 MG 24 hr tablet Take 25 mg by mouth daily.   [DISCONTINUED] metoprolol tartrate (LOPRESSOR) 100 MG tablet Take 100 mg 2 hours before Coronary CT   [DISCONTINUED] metroNIDAZOLE (FLAGYL) 500 MG tablet Take 500 mg by mouth 4 (four) times daily.   [DISCONTINUED] Nutritional Supplements (JUICE PLUS FIBRE PO) Take 2 capsules by mouth daily.   [DISCONTINUED] Red Yeast Rice Extract 600 MG CAPS Take 1,200 mg by mouth 2 (two) times daily.   [DISCONTINUED] vitamin E 400 UNIT capsule Take 400 Units by mouth daily.     Allergies:   Codeine, Darvon, and Demerol   Social History   Socioeconomic History   Marital status: Married    Spouse name: Not on file   Number of children: Not on file   Years of education: Not on file   Highest education level: Not on file  Occupational History   Occupation: wildlife rescue  Tobacco Use   Smoking status: Never   Smokeless  tobacco: Not on file  Substance and Sexual Activity   Alcohol use: No   Drug use: No   Sexual activity: Not on file  Other Topics Concern   Not on file  Social History Narrative   Not on file   Social Determinants of Health   Financial Resource Strain: Not on file  Food Insecurity: Not on file  Transportation Needs: Not on file  Physical Activity: Not on file  Stress: Not on file  Social Connections: Unknown (01/05/2023)   Received from [facility]   Social Network    Social Network: Not on file  Family History: The patient's family history includes Breast cancer in her maternal aunt; COPD in her mother; Colon cancer in her paternal uncle; Heart attack in her maternal aunt and paternal grandmother; Kidney cancer in her paternal grandfather; Liver cancer in her paternal aunt; Lung cancer in her sister; Throat cancer in her maternal uncle.  ROS:   Please see the history of present illness.     All other systems reviewed and are negative.  EKGs/Labs/Other Studies Reviewed:    The following studies were reviewed today:   EKG:   01/08/2023: Normal sinus rhythm, rate 70, no ST abnormalities, QTc 419  Recent Labs: No results found for requested labs within last 365 days.  Recent Lipid Panel    Component Value Date/Time   CHOL 218 (HH) 01/08/2008 1203   TRIG 247 (HH) 01/08/2008 1203   HDL 41.1 01/08/2008 1203   CHOLHDL 5.3 CALC 01/08/2008 1203   VLDL 49 (H) 01/08/2008 1203   LDLDIRECT 107.6 01/08/2008 1203    Physical Exam:    VS:  Pulse 70   Ht 5\' 6"  (1.676 m)   Wt 149 lb 9.6 oz (67.9 kg)   SpO2 95%   BMI 24.15 kg/m     Wt Readings from Last 3 Encounters:  01/08/23 149 lb 9.6 oz (67.9 kg)  09/20/15 135 lb (61.2 kg)  12/22/14 139 lb 4.8 oz (63.2 kg)     GEN:  Well nourished, well developed in no acute distress HEENT: Normal NECK: No JVD; No carotid bruits LYMPHATICS: No lymphadenopathy CARDIAC: RRR, no murmurs, rubs, gallops RESPIRATORY:  Clear to  auscultation without rales, wheezing or rhonchi  ABDOMEN: Soft, non-tender, non-distended MUSCULOSKELETAL:  No edema; No deformity  SKIN: Warm and dry NEUROLOGIC:  Alert and oriented x 3 PSYCHIATRIC:  Normal affect   ASSESSMENT:    1. Chest pain of uncertain etiology   2. Precordial pain   3. Essential hypertension   4. Hyperlipidemia, unspecified hyperlipidemia type    PLAN:    Chest pain: Atypical in description but does have multiple CAD risk factors (age, hypertension, hyperlipidemia, family history).  Recommend coronary CTA to rule out obstructive CAD.  Will hold her home Toprol-XL and give Lopressor 100 mg prior to study.  Check echocardiogram to rule out structural heart disease  Hypertension: On valsartan 320 mg daily and Toprol-XL 37.5 mg daily and amlodipine 5 mg daily.  Appears controlled  Hyperlipidemia: On nexlizet.  LDL 120 10/2022.  Will follow-up results of coronary CTA to guide how aggressive to be lowering cholesterol.  RTC in 3 months  Medication Adjustments/Labs and Tests Ordered: Current medicines are reviewed at length with the patient today.  Concerns regarding medicines are outlined above.  Orders Placed This Encounter  Procedures   CT CORONARY MORPH W/CTA COR W/SCORE W/CA W/CM &/OR WO/CM   EKG 12-Lead   ECHOCARDIOGRAM COMPLETE   Meds ordered this encounter  Medications   DISCONTD: metoprolol tartrate (LOPRESSOR) 100 MG tablet    Sig: Take 100 mg 2 hours before Coronary CT    Dispense:  1 tablet    Refill:  0    Patient Instructions  Medication Instructions:  Continue all medications *If you need a refill on your cardiac medications before your next appointment, please call your pharmacy*   Lab Work: None ordered   Testing/Procedures:  Coronary CT will be scheduled after approved by insurance    Follow instructions below   Schedule Echo first available      Follow-Up: At [facility], you and your health needs are our  priority.  As part of our continuing mission to provide you with exceptional heart care, we have created designated Provider Care Teams.  These Care Teams include your primary Cardiologist (physician) and Advanced Practice Providers (APPs -  Physician Assistants and Nurse Practitioners) who all work together to provide you with the care you need, when you need it.  We recommend signing up for the patient portal called "MyChart".  Sign up information is provided on this After Visit Summary.  MyChart is used to connect with patients for Virtual Visits (Telemedicine).  Patients are able to view lab/test results, encounter notes, upcoming appointments, etc.  Non-urgent messages can be sent to your provider as well.   To learn more about what you can do with MyChart, go to [email or web address].    Your next appointment:  3 months    Provider:  Dr.Leonte Horrigan        Your cardiac CT will be scheduled at one of the below locations:   [facility] [address] [place], [place] [address] [phone]  OR  [facility] [address] [place], [place] [address] [phone]  OR   [facility] [address] [place], [place] [address] [phone]  If scheduled at [facility], please arrive at the [facility] Entrance (Entrance C2) of [facility] 30 minutes prior to test start time. You can use the FREE valet parking offered at entrance C (encouraged to control the heart rate for the test)  Proceed to the [facility] Radiology Department (first floor) to check-in and test prep.  All radiology patients and guests should use entrance C2 at [facility], accessed from [facility], even though the hospital's physical address listed is [address]    If scheduled at [facility] or [facility], please arrive  15 mins early for check-in and test prep.  There is spacious parking and easy access to the radiology department from the [facility] entrance. Please enter here and check-in with the desk attendant.   Please follow these instructions carefully (unless otherwise directed):  An IV will be required for this test and Nitroglycerin will be given.    On the Night Before the Test: Be sure to Drink plenty of water. Do not consume any caffeinated/decaffeinated beverages or chocolate 12 hours prior to your test. Do not take any antihistamines 12 hours prior to your test.   On the Day of the Test: Drink plenty of water until 1 hour prior to the test. Do not eat any food 1 hour prior to test. You may take your regular medications prior to the test.  Hold Toprol XL morning of test Take metoprolol 100 mg two hours prior to test. If you take Furosemide/Hydrochlorothiazide/Spironolactone, please HOLD on the morning of the test. FEMALES- please wear underwire-free bra if available, avoid dresses & tight clothing       After the Test: Drink plenty of water. After receiving IV contrast, you may experience a mild flushed feeling. This is normal. On occasion, you may experience a mild rash up to 24 hours after the test. This is not dangerous. If this occurs, you can take Benadryl 25 mg and increase your fluid intake. If you experience trouble breathing, this can be serious. If it is severe call 911 IMMEDIATELY. If it is mild, please call our  office. If you take any of these medications: Glipizide/Metformin, Avandament, Glucavance, please do not take 48 hours after completing test unless otherwise instructed.  We will call to schedule your test 2-4 weeks out understanding that some insurance companies will need an authorization prior to the service being performed.   For more information and frequently asked questions, please visit our website : [email or web address]  For  non-scheduling related questions, please contact the cardiac imaging nurse navigator should you have any questions/concerns: Cardiac Imaging Nurse Navigators Direct Office Dial: [phone]   For scheduling needs, including cancellations and rescheduling, please call [place], [phone].      Signed, Little Ishikawa, MD  01/08/2023 10:58 PM    [facility]

## 2023-01-09 ENCOUNTER — Ambulatory Visit (HOSPITAL_COMMUNITY): Payer: PPO | Attending: Cardiovascular Disease

## 2023-01-09 DIAGNOSIS — R072 Precordial pain: Secondary | ICD-10-CM | POA: Diagnosis present

## 2023-01-09 DIAGNOSIS — R079 Chest pain, unspecified: Secondary | ICD-10-CM | POA: Insufficient documentation

## 2023-01-09 LAB — ECHOCARDIOGRAM COMPLETE
Area-P 1/2: 3.45 cm2
S' Lateral: 2 cm

## 2023-01-19 ENCOUNTER — Ambulatory Visit: Payer: PPO | Admitting: Cardiovascular Disease

## 2023-01-29 ENCOUNTER — Telehealth: Payer: Self-pay | Admitting: Cardiology

## 2023-01-29 NOTE — Telephone Encounter (Signed)
Pt stated when was was seen at her last visit on 01/08/2023, she was told someone would reach out to her regarding a cardiac cath but she hadn't heard anything yet. She also stated her interest in being referred to see an endocrinologist after being advised to do so by her church family and friends. Please advise.

## 2023-01-29 NOTE — Telephone Encounter (Signed)
Called patient left message on personal voice mail I reached out to scheduler at cardiac ct dept.She tried calling you.She left you a message to call her back and never heard back from you.She will call you again today and schedule.

## 2023-01-29 NOTE — Telephone Encounter (Signed)
Pt was returning nurse call regarding the referral to see an endocrinologist. She stated she was able to get her CT scheduled but now she'd like to be advised on what she should do or who she should see. Please advise.

## 2023-01-30 NOTE — Telephone Encounter (Signed)
I am not sure why requesting endocrinology appointment, that is not something we had talked about.  I am not sure what the indication for that would be.  From the cardiac standpoint, next step will be to follow-up results of coronary CTA

## 2023-01-30 NOTE — Telephone Encounter (Signed)
Called patient left Dr.Schumann's advice on personal voice mail.Advised to keep Coronary CT 12/4 at 9:30 am.

## 2023-02-13 ENCOUNTER — Telehealth (HOSPITAL_COMMUNITY): Payer: Self-pay | Admitting: Emergency Medicine

## 2023-02-13 ENCOUNTER — Telehealth (HOSPITAL_COMMUNITY): Payer: Self-pay | Admitting: *Deleted

## 2023-02-13 NOTE — Telephone Encounter (Signed)
Attempted to call patient regarding upcoming cardiac CT appointment. °Left message on voicemail with name and callback number °Dwan Fennel RN Navigator Cardiac Imaging °[facility] °[phone] Office °[phone] Cell ° °

## 2023-02-13 NOTE — Telephone Encounter (Signed)
Patient returning call about her upcoming cardiac imaging study; pt verbalizes understanding of appt date/time, parking situation and where to check in, pre-test NPO status and medications ordered, and verified current allergies; name and call back number provided for further questions should they arise  Larey Brick RN Navigator Cardiac Imaging [facility] [phone] office [phone] cell  Patient to take 100mg  metoprolol tartrate two hours prior to her cardiac CT scan. She is aware to at 9 AM.

## 2023-02-14 ENCOUNTER — Ambulatory Visit (HOSPITAL_COMMUNITY)
Admission: RE | Admit: 2023-02-14 | Discharge: 2023-02-14 | Disposition: A | Discharge status: Home / Self Care | Payer: PPO | Source: Ambulatory Visit | Attending: Cardiology | Admitting: Cardiology

## 2023-02-14 DIAGNOSIS — R079 Chest pain, unspecified: Secondary | ICD-10-CM | POA: Insufficient documentation

## 2023-02-14 DIAGNOSIS — R072 Precordial pain: Secondary | ICD-10-CM | POA: Diagnosis present

## 2023-02-14 MED ORDER — NITROGLYCERIN 0.4 MG SL SUBL
SUBLINGUAL_TABLET | SUBLINGUAL | Status: AC
  Filled 2023-02-14: qty 2

## 2023-02-14 MED ORDER — METOPROLOL TARTRATE 5 MG/5ML IV SOLN: 10.0000 mg | Freq: Once | INTRAVENOUS | Status: DC | PRN

## 2023-02-14 MED ORDER — DILTIAZEM HCL 25 MG/5ML IV SOLN: 10.0000 mg | INTRAVENOUS | Status: DC | PRN

## 2023-02-14 MED ORDER — NITROGLYCERIN 0.4 MG SL SUBL
0.8000 mg | SUBLINGUAL_TABLET | Freq: Once | SUBLINGUAL | Status: AC
  Administered 2023-02-14: 0.8 mg via SUBLINGUAL

## 2023-02-14 MED ORDER — IOHEXOL 350 MG/ML SOLN
95.0000 mL | Freq: Once | INTRAVENOUS | Status: AC | PRN
  Administered 2023-02-14: 95 mL via INTRAVENOUS

## 2023-05-04 ENCOUNTER — Ambulatory Visit: Payer: PPO | Admitting: Cardiology

## 2023-05-24 ENCOUNTER — Encounter: Payer: Self-pay | Admitting: Cardiology

## 2023-05-24 ENCOUNTER — Ambulatory Visit (INDEPENDENT_AMBULATORY_CARE_PROVIDER_SITE_OTHER)

## 2023-05-24 ENCOUNTER — Ambulatory Visit: Attending: Cardiology | Admitting: Cardiology

## 2023-05-24 VITALS — BP 128/72 | HR 67 | Resp 97 | Ht 66.0 in | Wt 153.0 lb

## 2023-05-24 DIAGNOSIS — R002 Palpitations: Secondary | ICD-10-CM | POA: Diagnosis not present

## 2023-05-24 DIAGNOSIS — I071 Rheumatic tricuspid insufficiency: Secondary | ICD-10-CM | POA: Diagnosis not present

## 2023-05-24 DIAGNOSIS — I10 Essential (primary) hypertension: Secondary | ICD-10-CM

## 2023-05-24 DIAGNOSIS — E785 Hyperlipidemia, unspecified: Secondary | ICD-10-CM

## 2023-05-24 DIAGNOSIS — R079 Chest pain, unspecified: Secondary | ICD-10-CM | POA: Diagnosis not present

## 2023-05-24 NOTE — Progress Notes (Signed)
 Cardiology Office Note:    Date:  05/24/2023   ID:  Vern Claude, DOB 08/14/1945, MRN [id number]  PCP:  Lavonda Jumbo, PA-C  Cardiologist:  None  Electrophysiologist:  None   Referring MD: Lavonda Jumbo, PA-C   Chief Complaint  Patient presents with   Palpitations    History of Present Illness:    Whitney Potter is a 78 y.o. female with a hx of hypertension, hyperlipidemia who presents for follow-up.  She was referred by Karna Dupes, PA for evaluation of chest pain.  She was seen in the ED at [facility] for chest pain on 01/05/2023.  Workup unremarkable.  She previously followed with Dr. [place], last seen in 2016.  She had normal Myoview in 2004.  Echocardiogram in 2009 was unremarkable.  Echo 01/09/23 showed EF 60-65%, normal RV function, moderate TR.  Coronary CT 02/14/23 showed normal coronary arteries, calcium score 0.  Since last clinic visit, she reports she is doing okay.  Has not had any chest pain recently.  Does report had an episode of palpitations today where she felt lightheaded, lasted less than 30 seconds.  She denies any shortness of breath.  She has not been exercising.   Past Medical History:  Diagnosis Date   H/O tracheostomy    Hyperlipidemia    Hypertension    PAC (premature atrial contraction)    UTI (urinary tract infection)     Past Surgical History:  Procedure Laterality Date   APPENDECTOMY     BREAST LUMPECTOMY     DILATION AND CURETTAGE OF UTERUS     TRACHEOSTOMY      Current Medications: Current Meds  Medication Sig   amLODipine (NORVASC) 5 MG tablet Take 1 tablet (5 mg total) by mouth daily.   B Complex-C (B-COMPLEX WITH VITAMIN C) tablet Take 1 tablet by mouth daily.   Bempedoic Acid-Ezetimibe (NEXLIZET) 180-10 MG TABS Take by mouth daily.   Cholecalciferol (VITAMIN D-3) 25 MCG (1000 UT) CAPS Take 2 capsules twice a day   Collagen-Vitamin C-Biotin (COLLAGEN PO) Take 6 g by mouth daily at 6 (six) AM. Pt takes 2-3 capsule daily    Digestive Enzymes (ENZYMATIC DIGESTANT) TBEC Take 1 tablet daily   diphenhydrAMINE (BENADRYL ALLERGY) 25 mg capsule Take 1 capsule (25 mg total) by mouth every 6 (six) hours as needed.   Echinacea 400 MG CAPS Take 400 mg daily   fenofibrate (TRICOR) 48 MG tablet Take 2 tablets daily   fluticasone (FLONASE) 50 MCG/ACT nasal spray Place 2 sprays into the nose 2 (two) times daily. 2 sprays in each nostril daily   guaifenesin (HUMIBID E) 400 MG TABS tablet Take 400 mg by mouth every 4 (four) hours.   Lutein 20 MG CAPS Take 20 mg daily   MAGNESIUM GLYCINATE PO Take 2-3 capsules by mouth at bedtime.   Melatonin 10 MG TABS Take 1/2 tablet ( 5 mg ) after dinner   metoprolol succinate (TOPROL XL) 25 MG 24 hr tablet Take 1 tablet (25 mg) in am and 1/2 tablet (12.5) mg in pm   Misc Natural Products (DEEP SLEEP) CAPS Take 1 capsule at bedtime   omeprazole (PRILOSEC) 20 MG capsule Take 40 mg by mouth 2 (two) times daily.    OVER THE COUNTER MEDICATION Take 2-4 tablets by mouth daily. Sinus calm   OVER THE COUNTER MEDICATION Take 1 capsule by mouth as needed. Stress Calm   Probiotic Product (PROBIOTIC FORMULA PO) Take 1 tablet by mouth daily.  Specialty Vitamins Products (COLLAGEN ULTRA) CAPS Take 6000 mg daily   valsartan (DIOVAN) 320 MG tablet Take 320 mg by mouth daily.   Vitamin E 670 MG (1000 UT) CAPS Take one capsule daily     Allergies:   Codeine, Darvon, and Demerol   Social History   Socioeconomic History   Marital status: Married    Spouse name: Not on file   Number of children: Not on file   Years of education: Not on file   Highest education level: Not on file  Occupational History   Occupation: wildlife rescue  Tobacco Use   Smoking status: Never   Smokeless tobacco: Not on file  Substance and Sexual Activity   Alcohol use: No   Drug use: No   Sexual activity: Not on file  Other Topics Concern   Not on file  Social History Narrative   Not on file   Social Drivers of  Health   Financial Resource Strain: Not on file  Food Insecurity: Not on file  Transportation Needs: Not on file  Physical Activity: Not on file  Stress: Not on file  Social Connections: Unknown (01/05/2023)   Received from [facility]   Social Network    Social Network: Not on file     Family History: The patient's family history includes Breast cancer in her maternal aunt; COPD in her mother; Colon cancer in her paternal uncle; Heart attack in her maternal aunt and paternal grandmother; Kidney cancer in her paternal grandfather; Liver cancer in her paternal aunt; Lung cancer in her sister; Throat cancer in her maternal uncle.  ROS:   Please see the history of present illness.     All other systems reviewed and are negative.  EKGs/Labs/Other Studies Reviewed:    The following studies were reviewed today:   EKG:   01/08/2023: Normal sinus rhythm, rate 70, no ST abnormalities, QTc 419 05/24/2023: Normal sinus rhythm, rate 67, no ST abnormalities  Recent Labs: No results found for requested labs within last 365 days.  Recent Lipid Panel    Component Value Date/Time   CHOL 218 (HH) 01/08/2008 1203   TRIG 247 (HH) 01/08/2008 1203   HDL 41.1 01/08/2008 1203   CHOLHDL 5.3 CALC 01/08/2008 1203   VLDL 49 (H) 01/08/2008 1203   LDLDIRECT 107.6 01/08/2008 1203    Physical Exam:    VS:  BP 128/72   Pulse 67   Resp (!) 97   Ht 5\' 6"  (1.676 m)   Wt 153 lb (69.4 kg)   BMI 24.69 kg/m     Wt Readings from Last 3 Encounters:  05/24/23 153 lb (69.4 kg)  01/08/23 149 lb 9.6 oz (67.9 kg)  09/20/15 135 lb (61.2 kg)     GEN:  Well nourished, well developed in no acute distress HEENT: Normal NECK: No JVD; No carotid bruits LYMPHATICS: No lymphadenopathy CARDIAC: RRR, no murmurs, rubs, gallops RESPIRATORY:  Clear to auscultation without rales, wheezing or rhonchi  ABDOMEN: Soft, non-tender, non-distended MUSCULOSKELETAL:  No edema; No deformity  SKIN: Warm and  dry NEUROLOGIC:  Alert and oriented x 3 PSYCHIATRIC:  Normal affect   ASSESSMENT:    1. Chest pain of uncertain etiology   2. Palpitations   3. Tricuspid valve insufficiency, unspecified etiology   4. Essential hypertension   5. Hyperlipidemia, unspecified hyperlipidemia type     PLAN:    Chest pain: Atypical in description. Echo 01/09/23 showed EF 60-65%, normal RV function, moderate TR.  Coronary CT 02/14/23  showed normal coronary arteries, calcium score 0. -No further cardiac workup recommended  Palpitations: Description concerning for arrhythmia, evaluate with Zio patch x 2 weeks  Tricuspid regurgitation: moderate on echo 01/09/23, will monitor.  Plan repeat echocardiogram in 1 year  Hypertension: On valsartan 320 mg daily and Toprol-XL 37.5 mg daily and amlodipine 5 mg daily.  Appears controlled  Hyperlipidemia: On nexlizet.  LDL 120 10/2022.  Normal coronary arteries on CTA 02/2023 as above  RTC in 1 year   Medication Adjustments/Labs and Tests Ordered: Current medicines are reviewed at length with the patient today.  Concerns regarding medicines are outlined above.  Orders Placed This Encounter  Procedures   LONG TERM MONITOR (3-14 DAYS)   EKG 12-Lead   ECHOCARDIOGRAM COMPLETE   No orders of the defined types were placed in this encounter.   Patient Instructions     Testing/Procedures:  Christena Deem- Long Term Monitor Instructions  Your physician has requested you wear a ZIO patch monitor for 14 days.  This is a single patch monitor. [employer] supplies one patch monitor per enrollment. Additional stickers are not available. Please do not apply patch if you will be having a Nuclear Stress Test,  Echocardiogram, Cardiac CT, MRI, or Chest Xray during the period you would be wearing the  monitor. The patch cannot be worn during these tests. You cannot remove and re-apply the  ZIO XT patch monitor.  Your ZIO patch monitor will be mailed 3 day USPS to your address on file.  It may take 3-5 days  to receive your monitor after you have been enrolled.  Once you have received your monitor, please review the enclosed instructions. Your monitor  has already been registered assigning a specific monitor serial # to you.  Billing and Patient Assistance Program Information  We have supplied [employer] with any of your insurance information on file for billing purposes. [employer] offers a sliding scale Patient Assistance Program for patients that do not have  insurance, or whose insurance does not completely cover the cost of the ZIO monitor.  You must apply for the Patient Assistance Program to qualify for this discounted rate.  To apply, please call [employer] at [phone], select option 4, select option 2, ask to apply for  Patient Assistance Program. Meredeth Ide will ask your household income, and how many people  are in your household. They will quote your out-of-pocket cost based on that information.  [employer] will also be able to set up a 65-month, interest-free payment plan if needed.  Applying the monitor   Shave hair from upper left chest.  Hold abrader disc by orange tab. Rub abrader in 40 strokes over the upper left chest as  indicated in your monitor instructions.  Clean area with 4 enclosed alcohol pads. Let dry.  Apply patch as indicated in monitor instructions. Patch will be placed under collarbone on left  side of chest with arrow pointing upward.  Rub patch adhesive wings for 2 minutes. Remove white label marked "1". Remove the white  label marked "2". Rub patch adhesive wings for 2 additional minutes.  While looking in a mirror, press and release button in center of patch. A small green light will  flash 3-4 times. This will be your only indicator that the monitor has been turned on.  Do not shower for the first 24 hours. You may shower after the first 24 hours.  Press the button if you feel a symptom. You will hear a small click. Record Date, Time  and   Symptom in the Patient Logbook.  When you are ready to remove the patch, follow instructions on the last 2 pages of Patient  Logbook. Stick patch monitor onto the last page of Patient Logbook.  Place Patient Logbook in the blue and white box. Use locking tab on box and tape box closed  securely. The blue and white box has prepaid postage on it. Please place it in the mailbox as  soon as possible. Your physician should have your test results approximately 7 days after the  monitor has been mailed back to [facility].  Call [facility] Customer Care at [phone] if you have questions regarding  your ZIO XT patch monitor. Call them immediately if you see an orange light blinking on your  monitor.  If your monitor falls off in less than 4 days, contact our Monitor department at [phone].  If your monitor becomes loose or falls off after 4 days call [employer] at [phone] for  suggestions on securing your monitor    Your physician has requested that you have an echocardiogram. Echocardiography is a painless test that uses sound waves to create images of your heart. It provides your doctor with information about the size and shape of your heart and how well your heart's chambers and valves are working. This procedure takes approximately one hour. There are no restrictions for this procedure. Please do NOT wear cologne, perfume, aftershave, or lotions (deodorant is allowed). Please arrive 15 minutes prior to your appointment time.  Please note: We ask at that you not bring children with you during ultrasound (echo/ vascular) testing. Due to room size and safety concerns, children are not allowed in the ultrasound rooms during exams. Our front office staff cannot provide observation of children in our lobby area while testing is being conducted. An adult accompanying a patient to their appointment will only be allowed in the ultrasound room at the discretion of the ultrasound technician  under special circumstances. We apologize for any inconvenience. SCHEDULE IN ONE YEAR   Follow-Up: At [facility], you and your health needs are our priority.  As part of our continuing mission to provide you with exceptional heart care, we have created designated Provider Care Teams.  These Care Teams include your primary Cardiologist (physician) and Advanced Practice Providers (APPs -  Physician Assistants and Nurse Practitioners) who all work together to provide you with the care you need, when you need it.  We recommend signing up for the patient portal called "MyChart".  Sign up information is provided on this After Visit Summary.  MyChart is used to connect with patients for Virtual Visits (Telemedicine).  Patients are able to view lab/test results, encounter notes, upcoming appointments, etc.  Non-urgent messages can be sent to your provider as well.   To learn more about what you can do with MyChart, go to [email or web address].    Your next appointment:   12 month(s)  Provider:   Epifanio Lesches MD          Signed, Little Ishikawa, MD  05/24/2023 5:37 PM    [facility]

## 2023-05-24 NOTE — Patient Instructions (Signed)
 Testing/Procedures:  Whitney Potter- Long Term Monitor Instructions  Your physician has requested you wear a ZIO patch monitor for 14 days.  This is a single patch monitor. [employer] supplies one patch monitor per enrollment. Additional stickers are not available. Please do not apply patch if you will be having a Nuclear Stress Test,  Echocardiogram, Cardiac CT, MRI, or Chest Xray during the period you would be wearing the  monitor. The patch cannot be worn during these tests. You cannot remove and re-apply the  ZIO XT patch monitor.  Your ZIO patch monitor will be mailed 3 day USPS to your address on file. It may take 3-5 days  to receive your monitor after you have been enrolled.  Once you have received your monitor, please review the enclosed instructions. Your monitor  has already been registered assigning a specific monitor serial # to you.  Billing and Patient Assistance Program Information  We have supplied [employer] with any of your insurance information on file for billing purposes. [employer] offers a sliding scale Patient Assistance Program for patients that do not have  insurance, or whose insurance does not completely cover the cost of the ZIO monitor.  You must apply for the Patient Assistance Program to qualify for this discounted rate.  To apply, please call [employer] at [phone], select option 4, select option 2, ask to apply for  Patient Assistance Program. Whitney Potter will ask your household income, and how many people  are in your household. They will quote your out-of-pocket cost based on that information.  [employer] will also be able to set up a 33-month, interest-free payment plan if needed.  Applying the monitor   Shave hair from upper left chest.  Hold abrader disc by orange tab. Rub abrader in 40 strokes over the upper left chest as  indicated in your monitor instructions.  Clean area with 4 enclosed alcohol pads. Let dry.  Apply patch as indicated in monitor  instructions. Patch will be placed under collarbone on left  side of chest with arrow pointing upward.  Rub patch adhesive wings for 2 minutes. Remove white label marked "1". Remove the white  label marked "2". Rub patch adhesive wings for 2 additional minutes.  While looking in a mirror, press and release button in center of patch. A small green light will  flash 3-4 times. This will be your only indicator that the monitor has been turned on.  Do not shower for the first 24 hours. You may shower after the first 24 hours.  Press the button if you feel a symptom. You will hear a small click. Record Date, Time and  Symptom in the Patient Logbook.  When you are ready to remove the patch, follow instructions on the last 2 pages of Patient  Logbook. Stick patch monitor onto the last page of Patient Logbook.  Place Patient Logbook in the blue and white box. Use locking tab on box and tape box closed  securely. The blue and white box has prepaid postage on it. Please place it in the mailbox as  soon as possible. Your physician should have your test results approximately 7 days after the  monitor has been mailed back to [facility].  Call [facility] Customer Care at [phone] if you have questions regarding  your ZIO XT patch monitor. Call them immediately if you see an orange light blinking on your  monitor.  If your monitor falls off in less than 4 days, contact our Monitor department at  [phone].  If your monitor becomes loose or falls off after 4 days call [employer] at [phone] for  suggestions on securing your monitor    Your physician has requested that you have an echocardiogram. Echocardiography is a painless test that uses sound waves to create images of your heart. It provides your doctor with information about the size and shape of your heart and how well your heart's chambers and valves are working. This procedure takes approximately one hour. There are no restrictions  for this procedure. Please do NOT wear cologne, perfume, aftershave, or lotions (deodorant is allowed). Please arrive 15 minutes prior to your appointment time.  Please note: We ask at that you not bring children with you during ultrasound (echo/ vascular) testing. Due to room size and safety concerns, children are not allowed in the ultrasound rooms during exams. Our front office staff cannot provide observation of children in our lobby area while testing is being conducted. An adult accompanying a patient to their appointment will only be allowed in the ultrasound room at the discretion of the ultrasound technician under special circumstances. We apologize for any inconvenience. SCHEDULE IN ONE YEAR   Follow-Up: At [facility], you and your health needs are our priority.  As part of our continuing mission to provide you with exceptional heart care, we have created designated Provider Care Teams.  These Care Teams include your primary Cardiologist (physician) and Advanced Practice Providers (APPs -  Physician Assistants and Nurse Practitioners) who all work together to provide you with the care you need, when you need it.  We recommend signing up for the patient portal called "MyChart".  Sign up information is provided on this After Visit Summary.  MyChart is used to connect with patients for Virtual Visits (Telemedicine).  Patients are able to view lab/test results, encounter notes, upcoming appointments, etc.  Non-urgent messages can be sent to your provider as well.   To learn more about what you can do with MyChart, go to [email or web address].    Your next appointment:   12 month(s)  Provider:   Epifanio Lesches MD

## 2023-05-24 NOTE — Progress Notes (Unsigned)
 Enrolled patient for a 14 day Zio XT  monitor to be mailed to patients home

## 2023-06-19 ENCOUNTER — Ambulatory Visit (HOSPITAL_BASED_OUTPATIENT_CLINIC_OR_DEPARTMENT_OTHER): Payer: PPO | Admitting: Cardiology

## 2023-07-04 DIAGNOSIS — R002 Palpitations: Secondary | ICD-10-CM

## 2023-07-10 ENCOUNTER — Encounter: Payer: Self-pay | Admitting: *Deleted

## 2023-07-17 ENCOUNTER — Telehealth: Payer: Self-pay | Admitting: Cardiology

## 2023-07-17 NOTE — Telephone Encounter (Signed)
 Pt c/o swelling/edema: STAT if pt has developed SOB within 24 hours  If swelling, where is the swelling located? Right leg, ankles, and feet-+ it aches also  How much weight have you gained and in what time span?   Have you gained 2 pounds in a day or 5 pounds in a week?   Do you have a log of your daily weights (if so, list)?   Are you currently taking a fluid pill? no  Are you currently SOB? Yes all the time if she exerts herself  Have you traveled recently in a car or plane for an extended period of time?

## 2023-07-17 NOTE — Telephone Encounter (Signed)
 Called and spoke with patient who states starting about a month ago she noticed swelling "from my shins to my feet." She states that the fluid recedes with elevation, but recurs almost immediately-not taking a full day. No abdominal girth. She condones SOB with most exertion, even a leisure walk to the mailbox can have her feeling winded. She states she drinks a lot of fluid. Per OV note with Alda Amas  on 05/24/23: Hypertension: On valsartan 320 mg daily and Toprol -XL 37.5 mg daily and amlodipine 5 mg daily. Appears controlled  Tricuspid regurgitation: moderate on echo 01/09/23, will monitor.  Plan repeat echocardiogram in 1 year   I educated patient about TR and it's progression. Also informed her that Amlodipine can cause LE swelling. Will send note to MD to advise on alternate BP medication and possible short course of diuretics.

## 2023-07-18 NOTE — Telephone Encounter (Signed)
 Can you bring her in for an appointment?  Either with me or APP is fine

## 2023-07-19 NOTE — Telephone Encounter (Signed)
 Called and patient has an appointment  scheduled on 5/12 with Marlana Silvan. Made patient aware to call office for any other questions.

## 2023-07-23 ENCOUNTER — Ambulatory Visit: Attending: Nurse Practitioner | Admitting: Cardiology

## 2023-07-23 VITALS — BP 144/76 | HR 71 | Ht 66.0 in | Wt 152.0 lb

## 2023-07-23 DIAGNOSIS — R600 Localized edema: Secondary | ICD-10-CM | POA: Diagnosis not present

## 2023-07-23 NOTE — Progress Notes (Signed)
 emergency Cardiology Office Note:  .   Date:  07/23/2023  ID:  Whitney Potter, DOB 11/28/1945, MRN [id number] PCP: Charlane Cones  [facility] Providers Cardiologist:  Wendie Hamburg, MD {    History of Present Illness: .   Whitney Potter is a 77 y.o. female with history of hypertension, hyperlipidemia,  She has had some atypical chest pain, coronary CT ordered in December 2024 with no evidence of any CAD, CAC 0.  Echo with preserved EF.  Moderate TR.  Remote normal Lexiscan 2004.  Had palpitations at last visit March 2025 with normal heart monitor results.  related to magnesium as she switched supplementation for this recently.  Patient had recently messaged triage and had complaints of unilateral peripheral edema.  No recent procedures or recent travel.  She is not sedentary.  Today she presents with a multitude of symptoms that include, fatigue, shortness of breath, behind the knee pain, cramps, ankle swelling.  She initially was concern for DVT although reports no history of travel, surgeries, cancer history or significant swelling.  No walking claudication symptoms.  Reports behind the knee not calf pain.  Reports speaking with her PCP about this prior was told it was fine.  Her other constitutional symptoms have been going on for about 1 month.  She reports having cough, congestion, flulike symptoms for last 3 weeks but did not see her PCP about it.   ROS: See HPI  Studies Reviewed: .    Heart monitor 12 days 06/20/2023  Patient had a min HR of 53 bpm, max HR of 160 bpm, and avg HR of 70 bpm. Predominant underlying rhythm was Sinus Rhythm. 3 Supraventricular Tachycardia runs occurred, the run with the fastest interval lasting 4 beats with a max rate of 160 bpm, the  longest lasting 6 beats with an avg rate of 139 bpm. Isolated SVEs were rare (<1.0%), SVE Couplets were rare (<1.0%), and SVE Triplets were rare (<1.0%). Isolated VEs were rare (<1.0%), and no VE  Couplets or VE Triplets were present.   Coronary CT 02/14/2023 1. No evidence of CAD, CADRADS = 0.   2. Coronary artery calcium score is 0.   3. Normal coronary origin with right dominance.   4. Aortic atherosclerosis with aortic root calcification.   5. Cardiovascular risk factor modification is recommended.   6. Consider non-coronary causes of chest pain.  Echocardiogram 01/09/2023 1. Left ventricular ejection fraction, by estimation, is 60 to 65%. Left  ventricular ejection fraction by 3D volume is 62 %. The left ventricle has  normal function. The left ventricle has no regional wall motion  abnormalities. Left ventricular diastolic   parameters were normal.   2. Right ventricular systolic function is normal. The right ventricular  size is normal. There is normal pulmonary artery systolic pressure.   3. The mitral valve is normal in structure. Trivial mitral valve  regurgitation. No evidence of mitral stenosis.   4. Tricuspid valve regurgitation is moderate.   5. The aortic valve is tricuspid. Aortic valve regurgitation is not  visualized. No aortic stenosis is present.   6. The inferior vena cava is normal in size with greater than 50%  respiratory variability, suggesting right atrial pressure of 3 mmHg.   Risk Assessment/Calculations:    Physical Exam:   VS:  BP (!) 144/76 (BP Location: Right Arm, Patient Position: Sitting, Cuff Size: Normal)   Pulse 71   Ht 5\' 6"  (1.676 m)   Wt 152 lb (  68.9 kg)   SpO2 97%   BMI 24.53 kg/m    Wt Readings from Last 3 Encounters:  07/23/23 152 lb (68.9 kg)  05/24/23 153 lb (69.4 kg)  01/08/23 149 lb 9.6 oz (67.9 kg)    GEN: Well nourished, well developed in no acute distress NECK: No JVD; No carotid bruits CARDIAC: RRR, no murmurs, rubs, gallops RESPIRATORY:  Clear to auscultation without rales, wheezing or rhonchi  ABDOMEN: Soft, non-tender, non-distended EXTREMITIES: Very trace ankle edema; No deformity   ASSESSMENT AND PLAN:  .    Lower ankle edema She has very trace ankle edema/foot.  Also reporting behind the knee pain has been going on for at least 9 months.  Very low suspicion for DVT given Wells score of 0.  May be musculoskeletal, follow-up with PCP.  Does not describe any orthopnea and looks euvolemic, do not think this is HFpEF.  Constitutional symptoms She is reporting fatigue, shortness of breath, cough, but also recently reports viral symptoms but did not get evaluated for this.  I do not think any of the symptoms are related to the cardiac system that she has had very extensive workup as below. We discussed and she would prefer to get evaluated by her PCP.  Consider checking CBC, BMP, TSH, viral panel  Atypical chest pain Workup unremarkable.  Coronary CT was normal coronary arteries and CAC score of 0.  Palpitations Unremarkable heart monitor x 2 weeks, no complaints recently.   On Toprol -XL 25 mg and 12.5 mg in the p.m.  Moderate tricuspid regurgitation Repeat echocardiogram 1 year  Hypertension Managed per primary care provider.   She is on amlodipine 5 mg, valsartan 320 mg   Dispo: 1 year  Signed, Burnetta Cart, PA-C

## 2023-07-23 NOTE — Patient Instructions (Signed)
 Medication Instructions:  Your physician recommends that you continue on your current medications as directed. Please refer to the Current Medication list given to you today.  *If you need a refill on your cardiac medications before your next appointment, please call your pharmacy*  Lab Work: NONE ordered at this time of appointment   Testing/Procedures: NONE ordered at this time of appointment   Follow-Up: At [facility], you and your health needs are our priority.  As part of our continuing mission to provide you with exceptional heart care, our providers are all part of one team.  This team includes your primary Cardiologist (physician) and Advanced Practice Providers or APPs (Physician Assistants and Nurse Practitioners) who all work together to provide you with the care you need, when you need it.  Your next appointment:   1 year(s)  Provider:   Wendie Hamburg, MD    We recommend signing up for the patient portal called "MyChart".  Sign up information is provided on this After Visit Summary.  MyChart is used to connect with patients for Virtual Visits (Telemedicine).  Patients are able to view lab/test results, encounter notes, upcoming appointments, etc.  Non-urgent messages can be sent to your provider as well.   To learn more about what you can do with MyChart, go to [email or web address].

## 2024-05-15 ENCOUNTER — Ambulatory Visit (HOSPITAL_COMMUNITY)
